# Patient Record
Sex: Male | Born: 1960 | Race: White | Hispanic: No | Marital: Married | Smoking: Never smoker
Health system: Southern US, Community
[De-identification: ages and names within clinical notes are randomized; demographics above are authoritative.]

## PROBLEM LIST (undated history)

## (undated) DIAGNOSIS — E781 Pure hyperglyceridemia: Secondary | ICD-10-CM

## (undated) DIAGNOSIS — H35102 Retinopathy of prematurity, unspecified, left eye: Secondary | ICD-10-CM

## (undated) DIAGNOSIS — K219 Gastro-esophageal reflux disease without esophagitis: Secondary | ICD-10-CM

## (undated) DIAGNOSIS — M797 Fibromyalgia: Secondary | ICD-10-CM

## (undated) DIAGNOSIS — G894 Chronic pain syndrome: Secondary | ICD-10-CM

## (undated) DIAGNOSIS — I10 Essential (primary) hypertension: Secondary | ICD-10-CM

## (undated) DIAGNOSIS — E119 Type 2 diabetes mellitus without complications: Secondary | ICD-10-CM

## (undated) DIAGNOSIS — I8393 Asymptomatic varicose veins of bilateral lower extremities: Secondary | ICD-10-CM

## (undated) DIAGNOSIS — E1140 Type 2 diabetes mellitus with diabetic neuropathy, unspecified: Secondary | ICD-10-CM

## (undated) DIAGNOSIS — G4730 Sleep apnea, unspecified: Secondary | ICD-10-CM

## (undated) HISTORY — PX: OTHER SURGICAL HISTORY: SHX169

## (undated) HISTORY — PX: PROSTATECTOMY: SHX69

---

## 2001-05-31 ENCOUNTER — Encounter: Admission: RE | Admit: 2001-05-31 | Discharge: 2001-08-29 | Payer: Self-pay | Admitting: Family Medicine

## 2001-06-19 ENCOUNTER — Encounter: Payer: Self-pay | Admitting: Family Medicine

## 2001-06-19 ENCOUNTER — Ambulatory Visit (HOSPITAL_COMMUNITY): Admission: RE | Admit: 2001-06-19 | Discharge: 2001-06-19 | Payer: Self-pay | Admitting: Family Medicine

## 2002-04-09 ENCOUNTER — Encounter: Payer: Self-pay | Admitting: Family Medicine

## 2002-04-09 ENCOUNTER — Ambulatory Visit (HOSPITAL_COMMUNITY): Admission: RE | Admit: 2002-04-09 | Discharge: 2002-04-09 | Payer: Self-pay | Admitting: Family Medicine

## 2003-03-16 ENCOUNTER — Emergency Department (HOSPITAL_COMMUNITY): Admission: EM | Admit: 2003-03-16 | Discharge: 2003-03-16 | Payer: Self-pay | Admitting: Internal Medicine

## 2003-10-02 ENCOUNTER — Emergency Department (HOSPITAL_COMMUNITY): Admission: EM | Admit: 2003-10-02 | Discharge: 2003-10-02 | Payer: Self-pay | Admitting: Internal Medicine

## 2004-07-12 ENCOUNTER — Ambulatory Visit (HOSPITAL_COMMUNITY): Admission: RE | Admit: 2004-07-12 | Discharge: 2004-07-12 | Payer: Self-pay | Admitting: Family Medicine

## 2004-08-15 ENCOUNTER — Emergency Department (HOSPITAL_COMMUNITY): Admission: EM | Admit: 2004-08-15 | Discharge: 2004-08-15 | Payer: Self-pay | Admitting: Emergency Medicine

## 2004-08-17 ENCOUNTER — Emergency Department (HOSPITAL_COMMUNITY): Admission: EM | Admit: 2004-08-17 | Discharge: 2004-08-17 | Payer: Self-pay | Admitting: *Deleted

## 2004-09-10 ENCOUNTER — Emergency Department (HOSPITAL_COMMUNITY): Admission: EM | Admit: 2004-09-10 | Discharge: 2004-09-10 | Payer: Self-pay | Admitting: Emergency Medicine

## 2004-11-30 ENCOUNTER — Ambulatory Visit: Payer: Self-pay | Admitting: Family Medicine

## 2005-03-20 ENCOUNTER — Emergency Department (HOSPITAL_COMMUNITY): Admission: EM | Admit: 2005-03-20 | Discharge: 2005-03-20 | Payer: Self-pay | Admitting: Emergency Medicine

## 2005-03-22 ENCOUNTER — Emergency Department (HOSPITAL_COMMUNITY): Admission: EM | Admit: 2005-03-22 | Discharge: 2005-03-22 | Payer: Self-pay | Admitting: Emergency Medicine

## 2005-04-25 ENCOUNTER — Ambulatory Visit: Payer: Self-pay | Admitting: Family Medicine

## 2005-04-26 ENCOUNTER — Ambulatory Visit (HOSPITAL_COMMUNITY): Admission: RE | Admit: 2005-04-26 | Discharge: 2005-04-26 | Payer: Self-pay | Admitting: Family Medicine

## 2005-05-10 ENCOUNTER — Ambulatory Visit: Admission: RE | Admit: 2005-05-10 | Discharge: 2005-05-10 | Payer: Self-pay | Admitting: Family Medicine

## 2005-05-25 ENCOUNTER — Ambulatory Visit: Payer: Self-pay | Admitting: Pulmonary Disease

## 2005-09-15 ENCOUNTER — Ambulatory Visit: Payer: Self-pay | Admitting: Family Medicine

## 2005-10-11 ENCOUNTER — Emergency Department (HOSPITAL_COMMUNITY): Admission: EM | Admit: 2005-10-11 | Discharge: 2005-10-11 | Payer: Self-pay | Admitting: Emergency Medicine

## 2006-04-20 ENCOUNTER — Emergency Department (HOSPITAL_COMMUNITY): Admission: EM | Admit: 2006-04-20 | Discharge: 2006-04-20 | Payer: Self-pay | Admitting: Emergency Medicine

## 2006-04-20 ENCOUNTER — Ambulatory Visit: Payer: Self-pay | Admitting: Family Medicine

## 2006-07-08 ENCOUNTER — Encounter: Payer: Self-pay | Admitting: Family Medicine

## 2006-07-08 LAB — CONVERTED CEMR LAB: PSA: 0.86 ng/mL

## 2007-01-08 ENCOUNTER — Ambulatory Visit: Payer: Self-pay | Admitting: Family Medicine

## 2007-01-09 ENCOUNTER — Encounter: Payer: Self-pay | Admitting: Family Medicine

## 2007-01-09 LAB — CONVERTED CEMR LAB
BUN: 20 mg/dL (ref 6–23)
CO2: 23 meq/L (ref 19–32)
Calcium: 9.5 mg/dL (ref 8.4–10.5)
Chloride: 103 meq/L (ref 96–112)
Cholesterol: 196 mg/dL (ref 0–200)
HDL: 38 mg/dL — ABNORMAL LOW (ref >39)
Hgb A1c MFr Bld: 9.8 % — ABNORMAL HIGH (ref 4.6–6.1)
Lymphocytes Relative: 31 % (ref 12–46)
Lymphs Abs: 2.3 10*3/uL (ref 0.7–3.3)
MCHC: 33.4 g/dL (ref 30.0–36.0)
MCV: 89.7 fL (ref 78.0–100.0)
Monocytes Relative: 9 % (ref 3–11)
Neutro Abs: 4.1 10*3/uL (ref 1.7–7.7)
Neutrophils Relative %: 56 % (ref 43–77)
Platelets: 204 10*3/uL (ref 150–400)
Potassium: 4.3 meq/L (ref 3.5–5.3)
Total CHOL/HDL Ratio: 5.2
Triglycerides: 630 mg/dL — ABNORMAL HIGH (ref <150)

## 2007-01-12 ENCOUNTER — Encounter: Payer: Self-pay | Admitting: Family Medicine

## 2007-01-12 LAB — CONVERTED CEMR LAB
ALT: 35 units/L (ref 0–53)
AST: 19 units/L (ref 0–37)
Alkaline Phosphatase: 70 units/L (ref 39–117)
Bilirubin, Direct: 0.1 mg/dL (ref 0.0–0.3)
Indirect Bilirubin: 0.4 mg/dL (ref 0.0–0.9)
Total Bilirubin: 0.5 mg/dL (ref 0.3–1.2)

## 2007-04-11 ENCOUNTER — Emergency Department (HOSPITAL_COMMUNITY): Admission: EM | Admit: 2007-04-11 | Discharge: 2007-04-11 | Payer: Self-pay | Admitting: Emergency Medicine

## 2007-05-11 ENCOUNTER — Encounter: Payer: Self-pay | Admitting: Family Medicine

## 2007-05-11 LAB — CONVERTED CEMR LAB
ALT: 45 units/L (ref 0–53)
AST: 22 units/L (ref 0–37)
Alkaline Phosphatase: 67 units/L (ref 39–117)
Calcium: 9.7 mg/dL (ref 8.4–10.5)
Cholesterol: 165 mg/dL (ref 0–200)
Creatinine, Ser: 0.97 mg/dL (ref 0.40–1.50)
HDL: 44 mg/dL (ref >39)
Hgb A1c MFr Bld: 8.5 % — ABNORMAL HIGH (ref 4.6–6.1)
Indirect Bilirubin: 0.5 mg/dL (ref 0.0–0.9)
Potassium: 4.1 meq/L (ref 3.5–5.3)
Total Protein: 7.1 g/dL (ref 6.0–8.3)
VLDL: 45 mg/dL — ABNORMAL HIGH (ref 0–40)

## 2007-11-07 ENCOUNTER — Ambulatory Visit (HOSPITAL_COMMUNITY): Admission: RE | Admit: 2007-11-07 | Discharge: 2007-11-07 | Payer: Self-pay | Admitting: Family Medicine

## 2007-11-07 ENCOUNTER — Ambulatory Visit: Payer: Self-pay | Admitting: Family Medicine

## 2007-11-07 LAB — CONVERTED CEMR LAB
ALT: 45 units/L (ref 0–53)
AST: 21 units/L (ref 0–37)
Albumin: 5 g/dL (ref 3.5–5.2)
Alkaline Phosphatase: 81 units/L (ref 39–117)
BUN: 16 mg/dL (ref 6–23)
Basophils Absolute: 0 10*3/uL (ref 0.0–0.1)
Basophils Relative: 0 % (ref 0–1)
Bilirubin, Direct: 0.1 mg/dL (ref 0.0–0.3)
Cholesterol: 173 mg/dL (ref 0–200)
Creatinine, Ser: 0.94 mg/dL (ref 0.40–1.50)
Eosinophils Relative: 3 % (ref 0–5)
HCT: 52.2 % — ABNORMAL HIGH (ref 39.0–52.0)
Hemoglobin: 18.5 g/dL — ABNORMAL HIGH (ref 13.0–17.0)
Indirect Bilirubin: 0.4 mg/dL (ref 0.0–0.9)
Lymphs Abs: 2.8 10*3/uL (ref 0.7–4.0)
MCV: 88 fL (ref 78.0–100.0)
Microalb, Ur: 16.7 mg/dL — ABNORMAL HIGH (ref 0.00–1.89)
Monocytes Absolute: 0.7 10*3/uL (ref 0.1–1.0)
Monocytes Relative: 8 % (ref 3–12)
Neutro Abs: 4.2 10*3/uL (ref 1.7–7.7)
Neutrophils Relative %: 53 % (ref 43–77)
RBC: 5.93 M/uL — ABNORMAL HIGH (ref 4.22–5.81)
RDW: 13.2 % (ref 11.5–15.5)
Sodium: 141 meq/L (ref 135–145)
Total Protein: 7.5 g/dL (ref 6.0–8.3)

## 2007-11-08 ENCOUNTER — Ambulatory Visit (HOSPITAL_COMMUNITY): Admission: RE | Admit: 2007-11-08 | Discharge: 2007-11-08 | Payer: Self-pay | Admitting: Family Medicine

## 2007-12-27 ENCOUNTER — Encounter: Payer: Self-pay | Admitting: Family Medicine

## 2008-03-12 ENCOUNTER — Ambulatory Visit: Payer: Self-pay | Admitting: Family Medicine

## 2008-07-22 ENCOUNTER — Encounter: Payer: Self-pay | Admitting: Family Medicine

## 2008-07-22 DIAGNOSIS — E669 Obesity, unspecified: Secondary | ICD-10-CM | POA: Insufficient documentation

## 2008-07-22 DIAGNOSIS — I10 Essential (primary) hypertension: Secondary | ICD-10-CM | POA: Insufficient documentation

## 2008-07-22 DIAGNOSIS — I868 Varicose veins of other specified sites: Secondary | ICD-10-CM | POA: Insufficient documentation

## 2008-07-22 DIAGNOSIS — E119 Type 2 diabetes mellitus without complications: Secondary | ICD-10-CM | POA: Insufficient documentation

## 2009-01-01 ENCOUNTER — Encounter: Payer: Self-pay | Admitting: Family Medicine

## 2011-01-25 NOTE — Letter (Signed)
Summary: RPC chart  RPC chart   Imported By: Curtis Sites 08/03/2010 10:56:45  _____________________________________________________________________  External Attachment:    Type:   Image     Comment:   External Document

## 2011-05-13 NOTE — Procedures (Signed)
NAME:  Steve Bryan, Steve Bryan NO.:  [id number]   MEDICAL RECORD NO.:  [id number]          PATIENT TYPE:  OUT   LOCATION:  SLEEP LAB                     FACILITY:  [facility]   PHYSICIAN:  Marcelyn Bruins, M.D. [facility] DATE OF BIRTH:  [birth year]   DATE OF STUDY:  05/10/2005                              NOCTURNAL POLYSOMNOGRAM   REFERRING PHYSICIAN:  Dr. Syliva Overman   DATE OF STUDY:  May 10, 2005   INDICATIONS FOR THE STUDY:  Hypersomnia with sleep apnea.   SLEEP ARCHITECTURE:  The patient has total sleep time of 353 minutes with  decreased REM and slow wave sleep. Sleep onset latency was mildly prolonged,  and REM onset was normal.   IMPRESSION:  1. Split night study reveals mild obstructive sleep apnea/hypopnea      syndrome with 38 obstructive events noted in the first 159 minutes of      sleep. This gave the patient a respiratory disturbance index of 14      events per hour extrapolated over the entire study, and there was      desaturation as low as 91%. The obstructive events all occurred in the      supine position and were associated with mild to moderate snoring. By      protocol, the patient was placed on a standard ResMed CPAP mask, and      pressure titration was initiated. A pressure of 6 cm seemed to control      the patient's obstructive events. However, there was breakthrough      snoring during his last REM, and, therefore, I would recommend a      starting pressure of 7 cm per treatment. It should also be noted the      patient's sleep apnea can be treated by weight loss, positional      therapy, or consideration of upper airway surgery/oral appliance.      Clinical correlation is suggested.  2. No cardiac arrhythmia.      KC/MEDQ  D:  05/25/2005 12:10:32  T:  05/25/2005 12:43:19  Job:  [id number]

## 2014-03-21 ENCOUNTER — Encounter (INDEPENDENT_AMBULATORY_CARE_PROVIDER_SITE_OTHER): Payer: Self-pay | Admitting: *Deleted

## 2014-04-01 ENCOUNTER — Telehealth (INDEPENDENT_AMBULATORY_CARE_PROVIDER_SITE_OTHER): Payer: Self-pay | Admitting: *Deleted

## 2014-04-01 ENCOUNTER — Other Ambulatory Visit (INDEPENDENT_AMBULATORY_CARE_PROVIDER_SITE_OTHER): Payer: Self-pay | Admitting: *Deleted

## 2014-04-01 ENCOUNTER — Encounter (INDEPENDENT_AMBULATORY_CARE_PROVIDER_SITE_OTHER): Payer: Self-pay | Admitting: *Deleted

## 2014-04-01 DIAGNOSIS — Z1211 Encounter for screening for malignant neoplasm of colon: Secondary | ICD-10-CM

## 2014-04-01 NOTE — Telephone Encounter (Signed)
[place] auth # [id number] for TCS

## 2014-05-20 ENCOUNTER — Telehealth (INDEPENDENT_AMBULATORY_CARE_PROVIDER_SITE_OTHER): Payer: Self-pay | Admitting: *Deleted

## 2014-05-20 NOTE — Telephone Encounter (Signed)
agree

## 2014-05-20 NOTE — Telephone Encounter (Signed)
  Procedure: tcs  Reason/Indication:  screening  Has patient had this procedure before?  no  If so, when, by whom and where?    Is there a family history of colon cancer?  no  Who?  What age when diagnosed?    Is patient diabetic?   yes      Does patient have prosthetic heart valve?  no  Do you have a pacemaker?  no  Has patient ever had endocarditis? no  Has patient had joint replacement within last 12 months?  no  Does patient tend to be constipated or take laxatives? no  Is patient on Coumadin, Plavix and/or Aspirin? no  Medications: metformin 500 mg 2 tab bid, glipizide 10 mg bid, lantus 10 units nightly, venlafaxine 37.5 mg 4 tabs bid, tramadol 50 mg bid, ergocalciferol 50,000 units 1 tab weekly, losartan 50 mg daily, omeprazole 20 mg daily  Allergies: lisinopril  Medication Adjustment: hold evening dose of metformin & glipizide evening before and morning of, no change to lantus  Procedure date & time: 06/19/14 at 930   tcs

## 2014-06-04 ENCOUNTER — Encounter (HOSPITAL_COMMUNITY): Payer: Self-pay | Admitting: Pharmacy Technician

## 2014-06-19 ENCOUNTER — Encounter (HOSPITAL_COMMUNITY)
Admission: RE | Disposition: A | Discharge status: Home / Self Care | Payer: Self-pay | Source: Ambulatory Visit | Attending: Internal Medicine

## 2014-06-19 ENCOUNTER — Ambulatory Visit (HOSPITAL_COMMUNITY)
Admission: RE | Admit: 2014-06-19 | Discharge: 2014-06-19 | Disposition: A | Discharge status: Home / Self Care | Payer: Non-veteran care | Source: Ambulatory Visit | Attending: Internal Medicine | Admitting: Internal Medicine

## 2014-06-19 ENCOUNTER — Encounter (HOSPITAL_COMMUNITY): Payer: Self-pay | Admitting: *Deleted

## 2014-06-19 DIAGNOSIS — Z1211 Encounter for screening for malignant neoplasm of colon: Secondary | ICD-10-CM

## 2014-06-19 DIAGNOSIS — Z794 Long term (current) use of insulin: Secondary | ICD-10-CM | POA: Insufficient documentation

## 2014-06-19 DIAGNOSIS — E1149 Type 2 diabetes mellitus with other diabetic neurological complication: Secondary | ICD-10-CM | POA: Insufficient documentation

## 2014-06-19 DIAGNOSIS — I10 Essential (primary) hypertension: Secondary | ICD-10-CM | POA: Insufficient documentation

## 2014-06-19 DIAGNOSIS — D126 Benign neoplasm of colon, unspecified: Secondary | ICD-10-CM | POA: Diagnosis not present

## 2014-06-19 DIAGNOSIS — E1142 Type 2 diabetes mellitus with diabetic polyneuropathy: Secondary | ICD-10-CM | POA: Insufficient documentation

## 2014-06-19 DIAGNOSIS — K644 Residual hemorrhoidal skin tags: Secondary | ICD-10-CM

## 2014-06-19 DIAGNOSIS — Z800 Family history of malignant neoplasm of digestive organs: Secondary | ICD-10-CM | POA: Insufficient documentation

## 2014-06-19 DIAGNOSIS — Z79899 Other long term (current) drug therapy: Secondary | ICD-10-CM | POA: Insufficient documentation

## 2014-06-19 DIAGNOSIS — K219 Gastro-esophageal reflux disease without esophagitis: Secondary | ICD-10-CM | POA: Insufficient documentation

## 2014-06-19 HISTORY — DX: Retinopathy of prematurity, unspecified, left eye: H35.102

## 2014-06-19 HISTORY — DX: Gastro-esophageal reflux disease without esophagitis: K21.9

## 2014-06-19 HISTORY — DX: Type 2 diabetes mellitus with diabetic neuropathy, unspecified: E11.40

## 2014-06-19 HISTORY — DX: Essential (primary) hypertension: I10

## 2014-06-19 HISTORY — DX: Sleep apnea, unspecified: G47.30

## 2014-06-19 HISTORY — DX: Type 2 diabetes mellitus without complications: E11.9

## 2014-06-19 HISTORY — PX: COLONOSCOPY: SHX5424

## 2014-06-19 LAB — GLUCOSE, CAPILLARY: Glucose-Capillary: 149 mg/dL — ABNORMAL HIGH (ref 70–99)

## 2014-06-19 SURGERY — COLONOSCOPY
Anesthesia: Moderate Sedation

## 2014-06-19 MED ORDER — MEPERIDINE HCL 50 MG/ML IJ SOLN
INTRAMUSCULAR | Status: AC
  Filled 2014-06-19: qty 1

## 2014-06-19 MED ORDER — MEPERIDINE HCL 50 MG/ML IJ SOLN
INTRAMUSCULAR | Status: DC | PRN
  Administered 2014-06-19 (×2): 25 mg via INTRAVENOUS

## 2014-06-19 MED ORDER — SODIUM CHLORIDE 0.9 % IV SOLN
INTRAVENOUS | Status: DC
  Administered 2014-06-19: 09:00:00 via INTRAVENOUS

## 2014-06-19 MED ORDER — MIDAZOLAM HCL 5 MG/5ML IJ SOLN
INTRAMUSCULAR | Status: DC | PRN
  Administered 2014-06-19: 3 mg via INTRAVENOUS
  Administered 2014-06-19 (×4): 2 mg via INTRAVENOUS

## 2014-06-19 MED ORDER — STERILE WATER FOR IRRIGATION IR SOLN
Status: DC | PRN
  Administered 2014-06-19: 10:00:00

## 2014-06-19 MED ORDER — MIDAZOLAM HCL 5 MG/5ML IJ SOLN
INTRAMUSCULAR | Status: AC
  Filled 2014-06-19: qty 5

## 2014-06-19 MED ORDER — MIDAZOLAM HCL 5 MG/5ML IJ SOLN
INTRAMUSCULAR | Status: AC
  Filled 2014-06-19: qty 10

## 2014-06-19 NOTE — Discharge Instructions (Signed)
Resume usual medications and diet. °No driving for 24 hours. °Physician will call with biopsy results. ° ° °Colonoscopy, Care After °Refer to this sheet in the next few weeks. These instructions provide you with information on caring for yourself after your procedure. Your health care provider may also give you more specific instructions. Your treatment has been planned according to current medical practices, but problems sometimes occur. Call your health care provider if you have any problems or questions after your procedure. °WHAT TO EXPECT AFTER THE PROCEDURE  °After your procedure, it is typical to have the following: °· A small amount of blood in your stool. °· Moderate amounts of gas and mild abdominal cramping or bloating. °HOME CARE INSTRUCTIONS °· Do not drive, operate machinery, or sign important documents for 24 hours. °· You may shower and resume your regular physical activities, but move at a slower pace for the first 24 hours. °· Take frequent rest periods for the first 24 hours. °· Walk around or put a warm pack on your abdomen to help reduce abdominal cramping and bloating. °· Drink enough fluids to keep your urine clear or pale yellow. °· You may resume your normal diet as instructed by your health care provider. Avoid heavy or fried foods that are hard to digest. °· Avoid drinking alcohol for 24 hours or as instructed by your health care provider. °· Only take over-the-counter or prescription medicines as directed by your health care provider. °· If a tissue sample (biopsy) was taken during your procedure: °· Do not take aspirin or blood thinners for 7 days, or as instructed by your health care provider. °· Do not drink alcohol for 7 days, or as instructed by your health care provider. °· Eat soft foods for the first 24 hours. °SEEK MEDICAL CARE IF: °You have persistent spotting of blood in your stool 2-3 days after the procedure. °SEEK IMMEDIATE MEDICAL CARE IF: °· You have more than a small  spotting of blood in your stool. °· You pass large blood clots in your stool. °· Your abdomen is swollen (distended). °· You have nausea or vomiting. °· You have a fever. °· You have increasing abdominal pain that is not relieved with medicine. °Document Released: 07/26/2004 Document Revised: 10/02/2013 Document Reviewed: 08/19/2013 °ExitCare® Patient Information ©2015 [employer]. This information is not intended to replace advice given to you by your health care provider. Make sure you discuss any questions you have with your health care provider. ° ° °Colon Polyps °Polyps are lumps of extra tissue growing inside the body. Polyps can grow in the large intestine (colon). Most colon polyps are noncancerous (benign). However, some colon polyps can become cancerous over time. Polyps that are larger than a pea may be harmful. To be safe, caregivers remove and test all polyps. °CAUSES  °Polyps form when mutations in the genes cause your cells to grow and divide even though no more tissue is needed. °RISK FACTORS °There are a number of risk factors that can increase your chances of getting colon polyps. They include: °· Being older than 50 years. °· Family history of colon polyps or colon cancer. °· Long-term colon diseases, such as colitis or Crohn disease. °· Being overweight. °· Smoking. °· Being inactive. °· Drinking too much alcohol. °SYMPTOMS  °Most small polyps do not cause symptoms. If symptoms are present, they may include: °· Blood in the stool. The stool may look dark red or black. °· Constipation or diarrhea that lasts longer than 1 week. °  DIAGNOSIS °People often do not know they have polyps until their caregiver finds them during a regular checkup. Your caregiver can use 4 tests to check for polyps: °· Digital rectal exam. The caregiver wears gloves and feels inside the rectum. This test would find polyps only in the rectum. °· Barium enema. The caregiver puts a liquid called barium into your rectum before  taking X-rays of your colon. Barium makes your colon look white. Polyps are dark, so they are easy to see in the X-ray pictures. °· Sigmoidoscopy. A thin, flexible tube (sigmoidoscope) is placed into your rectum. The sigmoidoscope has a light and tiny camera in it. The caregiver uses the sigmoidoscope to look at the last third of your colon. °· Colonoscopy. This test is like sigmoidoscopy, but the caregiver looks at the entire colon. This is the most common method for finding and removing polyps. °TREATMENT  °Any polyps will be removed during a sigmoidoscopy or colonoscopy. The polyps are then tested for cancer. °PREVENTION  °To help lower your risk of getting more colon polyps: °· Eat plenty of fruits and vegetables. Avoid eating fatty foods. °· Do not smoke. °· Avoid drinking alcohol. °· Exercise every day. °· Lose weight if recommended by your caregiver. °· Eat plenty of calcium and folate. Foods that are rich in calcium include milk, cheese, and broccoli. Foods that are rich in folate include chickpeas, kidney beans, and spinach. °HOME CARE INSTRUCTIONS °Keep all follow-up appointments as directed by your caregiver. You may need periodic exams to check for polyps. °SEEK MEDICAL CARE IF: °You notice bleeding during a bowel movement. °Document Released: 09/07/2004 Document Revised: 03/05/2012 Document Reviewed: 02/21/2012 °ExitCare® Patient Information ©2015 [employer]. This information is not intended to replace advice given to you by your health care provider. Make sure you discuss any questions you have with your health care provider. ° °

## 2014-06-19 NOTE — H&P (Signed)
Steve Bryan is an 53 y.o. male.   Chief Complaint: Patient is here for colonoscopy. HPI: Patient is 53 year old Caucasian male who is in for screening colonoscopy. He denies abdominal pain change in his bowel habits or rectal bleeding. Family history is negative for CRC.  Past Medical History  Diagnosis Date  . Hypertension   . Diabetes mellitus without complication   . Sleep apnea   . GERD (gastroesophageal reflux disease)   . Diabetic neuropathy   . Left retinopathy of prematurity     Past Surgical History  Procedure Laterality Date  . Right inguinal hernia repair      Family History  Problem Relation Age of Onset  . Colon cancer Neg Hx    Social History:  reports that he has never smoked. He does not have any smokeless tobacco history on file. He reports that he does not drink alcohol or use illicit drugs.  Allergies: No Known Allergies  Medications Prior to Admission  Medication Sig Dispense Refill  . ergocalciferol (VITAMIN D2) 50000 UNITS capsule Take 50,000 Units by mouth once a week.      Marland Kitchen glipiZIDE (GLUCOTROL) 10 MG tablet Take 10 mg by mouth 2 (two) times daily before a meal.      . insulin glargine (LANTUS) 100 UNIT/ML injection Inject 10 Units into the skin at bedtime.      Marland Kitchen losartan (COZAAR) 50 MG tablet Take 50 mg by mouth daily.      . metFORMIN (GLUCOPHAGE) 500 MG tablet Take 1,000 mg by mouth 2 (two) times daily with a meal.      . omeprazole (PRILOSEC) 20 MG capsule Take 20 mg by mouth daily.      . tamsulosin (FLOMAX) 0.4 MG CAPS capsule Take 0.4 mg by mouth.        Results for orders placed during the hospital encounter of 06/19/14 (from the past 48 hour(s))  GLUCOSE, CAPILLARY     Status: Abnormal   Collection Time    06/19/14  9:04 AM      Result Value Ref Range   Glucose-Capillary 149 (*) 70 - 99 mg/dL   Comment 1 Documented in Chart     No results found.  ROS  Blood pressure 153/89, pulse 66, temperature 98.3 F (36.8 C), temperature  source Oral, resp. rate 16, height 6\' 1"  (1.854 m), weight 240 lb (108.863 kg), SpO2 98.00%. Physical Exam  Constitutional: He appears well-developed and well-nourished.  HENT:  Mouth/Throat: Oropharynx is clear and moist.  Eyes: Conjunctivae are normal. No scleral icterus.  Neck: No thyromegaly present.  Cardiovascular: Normal rate, regular rhythm and normal heart sounds.   No murmur heard. Respiratory: Effort normal and breath sounds normal.  GI: Soft. He exhibits no distension and no mass. There is no tenderness.  Musculoskeletal: He exhibits no edema.  Lymphadenopathy:    He has no cervical adenopathy.  Neurological: He is alert.  Skin: Skin is warm and dry.     Assessment/Plan Average risk screening colonoscopy.  SteveNAJEEB Bryan 06/19/2014, 9:37 AM

## 2014-06-19 NOTE — Op Note (Signed)
COLONOSCOPY PROCEDURE REPORT  PATIENT:  Steve Bryan  MR#:  [id number] Birthdate:  [birth year], 53 y.o., male Endoscopist:  Dr. Rogene Houston, MD Referred By:  Dr. Cleophas Dunker, MD Procedure Date: 06/19/2014  Procedure:   Colonoscopy  Indications:  Patient is 54 year old Caucasian male who is undergoing average risk screening colonoscopy.  Informed Consent:  The procedure and risks were reviewed with the patient and informed consent was obtained.  Medications:  Demerol 50 mg IV Versed 11 mg IV  Description of procedure:  After a digital rectal exam was performed, that colonoscope was advanced from the anus through the rectum and colon to the area of the cecum, ileocecal valve and appendiceal orifice. The cecum was deeply intubated. These structures were well-seen and photographed for the record. From the level of the cecum and ileocecal valve, the scope was slowly and cautiously withdrawn. The mucosal surfaces were carefully surveyed utilizing scope tip to flexion to facilitate fold flattening as needed. The scope was pulled down into the rectum where a thorough exam including retroflexion was performed.  Findings:   Prep satisfactory. 4 mm polyp ablated via cold biopsy from proximal transverse colon. Mucosa of the rest of the colon was normal. Normal rectal mucosa.  small hemorrhoids below the dentate line.   Therapeutic/Diagnostic Maneuvers Performed:  See above  Complications:  None  Cecal Withdrawal Time:  17 minutes  Impression:  Examination performed to cecum. 4 mm polyp ablated via cold biopsy from proximal transverse colon. Small external hemorrhoids.  Recommendations:  Standard instructions given. I will contact patient with biopsy results and further recommendations.  [name],[name]  06/19/2014 10:16 AM  CC: Dr. Cleophas Dunker, MD & Dr. Rayne Du ref. provider found

## 2014-06-20 ENCOUNTER — Encounter (HOSPITAL_COMMUNITY): Payer: Self-pay | Admitting: Internal Medicine

## 2015-10-29 ENCOUNTER — Ambulatory Visit (HOSPITAL_BASED_OUTPATIENT_CLINIC_OR_DEPARTMENT_OTHER): Payer: Non-veteran care | Attending: Internal Medicine

## 2015-10-29 VITALS — Ht 73.0 in | Wt 250.0 lb

## 2015-10-29 DIAGNOSIS — R0683 Snoring: Secondary | ICD-10-CM | POA: Diagnosis not present

## 2015-10-29 DIAGNOSIS — G4733 Obstructive sleep apnea (adult) (pediatric): Secondary | ICD-10-CM | POA: Insufficient documentation

## 2015-10-29 DIAGNOSIS — G4700 Insomnia, unspecified: Secondary | ICD-10-CM | POA: Insufficient documentation

## 2015-10-31 DIAGNOSIS — G4733 Obstructive sleep apnea (adult) (pediatric): Secondary | ICD-10-CM | POA: Diagnosis not present

## 2015-10-31 NOTE — Progress Notes (Signed)
   Patient Name: Amol, Domanski Date: 10/29/2015 Gender: Male D.O.B: [birth year] Age (years): 75 Referring Provider: Baird Lyons MD, ABSM Height (inches): 73 Interpreting Physician: Baird Lyons MD, ABSM Weight (lbs): 250 RPSGT: Joni Reining BMI: 33 MRN: [id number] Neck Size: 18.00 CLINICAL INFORMATION Sleep Study Type: NPSG Indication for sleep study: OSA Epworth Sleepiness Score: 2  SLEEP STUDY TECHNIQUE As per the AASM Manual for the Scoring of Sleep and Associated Events v2.3 (April 2016) with a hypopnea requiring 4% desaturations. The channels recorded and monitored were frontal, central and occipital EEG, electrooculogram (EOG), submentalis EMG (chin), nasal and oral airflow, thoracic and abdominal wall motion, anterior tibialis EMG, snore microphone, electrocardiogram, and pulse oximetry.  MEDICATIONS Patient's medications include: charted for review Medications self-administered by patient during sleep study : No sleep medicine administered . SLEEP ARCHITECTURE The study was initiated at 9:52:30 PM and ended at 4:34:37 AM. Sleep onset time was 53.2 minutes and the sleep efficiency was 54.0%. The total sleep time was 217.0 minutes. Stage REM latency was 206.0 minutes. The patient spent 20.49% of the night in stage N1 sleep, 67.99% in stage N2 sleep, 0.00% in stage N3 and 11.52% in REM. Alpha intrusion was absent. Supine sleep was 17.72%. Wake after sleep onset 132 minutes  RESPIRATORY PARAMETERS The overall apnea/hypopnea index (AHI) was 6.1 per hour. There were 3 total apneas, including 2 obstructive, 1 central and 0 mixed apneas. There were 19 hypopneas and 5 RERAs. The AHI during Stage REM sleep was 0.0 per hour. AHI while supine was 34.3 per hour. The mean oxygen saturation was 91.76%. The minimum SpO2 during sleep was 86.00%. Moderate snoring was noted during this study.  CARDIAC DATA The 2 lead EKG demonstrated sinus rhythm. The mean heart rate was  78.91 beats per minute. Other EKG findings include: None.  LEG MOVEMENT DATA The total PLMS were 13 with a resulting PLMS index of 3.60. Associated arousal with leg movement index was 0.3 .  IMPRESSIONS - Mild obstructive sleep apnea occurred during this study (AHI = 6.1/h). - No significant central sleep apnea occurred during this study (CAI = 0.3/h). - Mild oxygen desaturation was noted during this study (Min O2 = 86.00%). - The patient snored with Moderate snoring volume. - No cardiac abnormalities were noted during this study. - Clinically significant periodic limb movements did not occur during sleep. No significant associated arousals. - Difficulty initiating and maintaining sleep-Insomnia. Described by technician as initially anxious about the study.  DIAGNOSIS - Obstructive Sleep Apnea (327.23 [G47.33 ICD-10]) - Insomnia  RECOMMENDATIONS - Positional therapy avoiding supine position during sleep. - Very mild obstructive sleep apnea. Return to discuss treatment options. - Avoid alcohol, sedatives and other CNS depressants that may worsen sleep apnea and disrupt normal sleep architecture. - Sleep hygiene should be reviewed to assess factors that may improve sleep quality. - Weight management and regular exercise should be initiated or continued if appropriate.  Deneise Lever Diplomate, American Board of Sleep Medicine  ELECTRONICALLY SIGNED ON:  10/31/2015, 12:57 PM [place] PH: [phone]   FX: [phone] [place]

## 2016-11-25 ENCOUNTER — Other Ambulatory Visit (HOSPITAL_BASED_OUTPATIENT_CLINIC_OR_DEPARTMENT_OTHER): Payer: Self-pay

## 2016-11-25 ENCOUNTER — Ambulatory Visit (HOSPITAL_BASED_OUTPATIENT_CLINIC_OR_DEPARTMENT_OTHER): Payer: Non-veteran care | Attending: Internal Medicine | Admitting: Internal Medicine

## 2016-11-25 VITALS — Ht 73.0 in | Wt 245.0 lb

## 2016-11-25 DIAGNOSIS — R5383 Other fatigue: Secondary | ICD-10-CM

## 2016-11-25 DIAGNOSIS — I10 Essential (primary) hypertension: Secondary | ICD-10-CM | POA: Diagnosis not present

## 2016-11-25 DIAGNOSIS — E119 Type 2 diabetes mellitus without complications: Secondary | ICD-10-CM | POA: Insufficient documentation

## 2016-11-25 DIAGNOSIS — R0683 Snoring: Secondary | ICD-10-CM

## 2016-11-25 DIAGNOSIS — Z6832 Body mass index (BMI) 32.0-32.9, adult: Secondary | ICD-10-CM | POA: Insufficient documentation

## 2016-11-25 DIAGNOSIS — G4710 Hypersomnia, unspecified: Secondary | ICD-10-CM

## 2016-11-25 DIAGNOSIS — R0681 Apnea, not elsewhere classified: Secondary | ICD-10-CM | POA: Insufficient documentation

## 2016-11-25 DIAGNOSIS — G4730 Sleep apnea, unspecified: Secondary | ICD-10-CM

## 2016-11-25 DIAGNOSIS — E669 Obesity, unspecified: Secondary | ICD-10-CM | POA: Insufficient documentation

## 2016-11-25 DIAGNOSIS — G4761 Periodic limb movement disorder: Secondary | ICD-10-CM | POA: Insufficient documentation

## 2016-11-25 DIAGNOSIS — G4733 Obstructive sleep apnea (adult) (pediatric): Secondary | ICD-10-CM

## 2016-12-03 DIAGNOSIS — G4733 Obstructive sleep apnea (adult) (pediatric): Secondary | ICD-10-CM | POA: Diagnosis not present

## 2016-12-03 NOTE — Procedures (Signed)
  Patient Name: Steve Bryan, Steve Bryan Date: 11/25/2016 Gender: Male D.O.B: 31-Jul-1961 Age (years): 35 Referring Provider: NATHALIE ABI HATEM MD Height (inches): 73 Interpreting Physician: Baird Lyons MD, ABSM Weight (lbs): 245 RPSGT: Jonna Coup BMI: 32 MRN: [id number] Neck Size: 17.50  CLINICAL INFORMATION Sleep Study Type: NPSG Indication for sleep study: Daytime Fatigue, Diabetes, Fatigue, Hypertension, Nocturnal Gasping, Non-refreshing Sleep, Obesity, Snoring Epworth Sleepiness Score: 9  Most recent polysomnogram dated 10/29/2015 revealed an AHI of 6.1/h and RDI of 7.5/h.  SLEEP STUDY TECHNIQUE As per the AASM Manual for the Scoring of Sleep and Associated Events v2.3 (April 2016) with a hypopnea requiring 4% desaturations. The channels recorded and monitored were frontal, central and occipital EEG, electrooculogram (EOG), submentalis EMG (chin), nasal and oral airflow, thoracic and abdominal wall motion, anterior tibialis EMG, snore microphone, electrocardiogram, and pulse oximetry.  MEDICATIONS Medications self-administered by patient taken the night of the study : none reported  SLEEP ARCHITECTURE The study was initiated at 9:34:04 PM and ended at 4:36:02 AM. Sleep onset time was 5.2 minutes and the sleep efficiency was 71.3%. The total sleep time was 300.8 minutes. Stage REM latency was 141.5 minutes. The patient spent 6.65% of the night in stage N1 sleep, 80.55% in stage N2 sleep, 0.00% in stage N3 and 12.80% in REM. Alpha intrusion was absent. Supine sleep was 29.25%.  RESPIRATORY PARAMETERS The overall apnea/hypopnea index (AHI) was 3.0 per hour. There were 1 total apneas, including 1 obstructive, 0 central and 0 mixed apneas. There were 14 hypopneas and 6 RERAs. The AHI during Stage REM sleep was 17.1 per hour. AHI while supine was 10.2 per hour. The mean oxygen saturation was 92.75%. The minimum SpO2 during sleep was 89.00%. Soft snoring was noted during  this study.  CARDIAC DATA The 2 lead EKG demonstrated sinus rhythm. The mean heart rate was 79.15 beats per minute. Other EKG findings include: PVCs.  LEG MOVEMENT DATA The total PLMS were 37 with a resulting PLMS index of 7.38. Associated arousal with leg movement index was 2.0 .  IMPRESSIONS - No significant obstructive sleep apnea occurred during this study (AHI = 3.0/h). - No significant central sleep apnea occurred during this study (CAI = 0.0/h). - The patient had minimal or no oxygen desaturation during the study (Min O2 = 89.00%) - The patient snored with Soft snoring volume. - EKG findings include PVCs. - Mild periodic limb movements of sleep occurred during the study. No significant associated arousals.  DIAGNOSIS - Normal study  RECOMMENDATIONS - Positional therapy avoiding supine position during sleep. - Avoid alcohol, sedatives and other CNS depressants that may worsen sleep apnea and disrupt normal sleep architecture. - Sleep hygiene should be reviewed to assess factors that may improve sleep quality. - Weight management and regular exercise should be initiated or continued if appropriate.  [Electronically signed] 12/03/2016 10:00 AM  Baird Lyons MD, ABSM Diplomate, American Board of Sleep Medicine   NPI: [id number]  Playa Fortuna, American Board of Sleep Medicine  ELECTRONICALLY SIGNED ON:  12/03/2016, 10:02 AM [place] PH: [phone]   FX: [phone] [place]

## 2017-02-27 ENCOUNTER — Emergency Department (HOSPITAL_COMMUNITY): Payer: Non-veteran care

## 2017-02-27 ENCOUNTER — Encounter (HOSPITAL_COMMUNITY): Payer: Self-pay | Admitting: Emergency Medicine

## 2017-02-27 ENCOUNTER — Inpatient Hospital Stay (HOSPITAL_COMMUNITY)
Admission: EM | Admit: 2017-02-27 | Discharge: 2017-03-01 | DRG: 282 | Disposition: A | Discharge status: Home / Self Care | Payer: Non-veteran care | Attending: Cardiovascular Disease | Admitting: Cardiovascular Disease

## 2017-02-27 DIAGNOSIS — R079 Chest pain, unspecified: Secondary | ICD-10-CM | POA: Diagnosis present

## 2017-02-27 DIAGNOSIS — Z888 Allergy status to other drugs, medicaments and biological substances status: Secondary | ICD-10-CM

## 2017-02-27 DIAGNOSIS — M797 Fibromyalgia: Secondary | ICD-10-CM | POA: Diagnosis present

## 2017-02-27 DIAGNOSIS — I361 Nonrheumatic tricuspid (valve) insufficiency: Secondary | ICD-10-CM | POA: Diagnosis not present

## 2017-02-27 DIAGNOSIS — G4730 Sleep apnea, unspecified: Secondary | ICD-10-CM | POA: Diagnosis present

## 2017-02-27 DIAGNOSIS — E669 Obesity, unspecified: Secondary | ICD-10-CM | POA: Diagnosis present

## 2017-02-27 DIAGNOSIS — Z6833 Body mass index (BMI) 33.0-33.9, adult: Secondary | ICD-10-CM

## 2017-02-27 DIAGNOSIS — G894 Chronic pain syndrome: Secondary | ICD-10-CM | POA: Diagnosis present

## 2017-02-27 DIAGNOSIS — R748 Abnormal levels of other serum enzymes: Secondary | ICD-10-CM | POA: Diagnosis not present

## 2017-02-27 DIAGNOSIS — I214 Non-ST elevation (NSTEMI) myocardial infarction: Principal | ICD-10-CM | POA: Diagnosis present

## 2017-02-27 DIAGNOSIS — R7989 Other specified abnormal findings of blood chemistry: Secondary | ICD-10-CM

## 2017-02-27 DIAGNOSIS — E781 Pure hyperglyceridemia: Secondary | ICD-10-CM | POA: Diagnosis present

## 2017-02-27 DIAGNOSIS — E1140 Type 2 diabetes mellitus with diabetic neuropathy, unspecified: Secondary | ICD-10-CM | POA: Diagnosis present

## 2017-02-27 DIAGNOSIS — Z79899 Other long term (current) drug therapy: Secondary | ICD-10-CM

## 2017-02-27 DIAGNOSIS — R0789 Other chest pain: Secondary | ICD-10-CM | POA: Diagnosis present

## 2017-02-27 DIAGNOSIS — R778 Other specified abnormalities of plasma proteins: Secondary | ICD-10-CM

## 2017-02-27 DIAGNOSIS — K219 Gastro-esophageal reflux disease without esophagitis: Secondary | ICD-10-CM | POA: Diagnosis present

## 2017-02-27 DIAGNOSIS — I10 Essential (primary) hypertension: Secondary | ICD-10-CM | POA: Diagnosis present

## 2017-02-27 DIAGNOSIS — I480 Paroxysmal atrial fibrillation: Secondary | ICD-10-CM | POA: Diagnosis present

## 2017-02-27 DIAGNOSIS — I272 Pulmonary hypertension, unspecified: Secondary | ICD-10-CM | POA: Diagnosis present

## 2017-02-27 DIAGNOSIS — M7989 Other specified soft tissue disorders: Secondary | ICD-10-CM | POA: Diagnosis present

## 2017-02-27 DIAGNOSIS — R9439 Abnormal result of other cardiovascular function study: Secondary | ICD-10-CM | POA: Diagnosis present

## 2017-02-27 DIAGNOSIS — Z794 Long term (current) use of insulin: Secondary | ICD-10-CM | POA: Diagnosis not present

## 2017-02-27 HISTORY — DX: Fibromyalgia: M79.7

## 2017-02-27 HISTORY — DX: Chronic pain syndrome: G89.4

## 2017-02-27 HISTORY — DX: Asymptomatic varicose veins of bilateral lower extremities: I83.93

## 2017-02-27 HISTORY — DX: Pure hyperglyceridemia: E78.1

## 2017-02-27 LAB — COMPREHENSIVE METABOLIC PANEL
ALT: 50 U/L (ref 17–63)
AST: 31 U/L (ref 15–41)
Albumin: 4.2 g/dL (ref 3.5–5.0)
Alkaline Phosphatase: 67 U/L (ref 38–126)
Anion gap: 9 (ref 5–15)
BUN: 17 mg/dL (ref 6–20)
CO2: 21 mmol/L — ABNORMAL LOW (ref 22–32)
Calcium: 9.4 mg/dL (ref 8.9–10.3)
Chloride: 107 mmol/L (ref 101–111)
Creatinine, Ser: 0.99 mg/dL (ref 0.61–1.24)
Glucose, Bld: 226 mg/dL — ABNORMAL HIGH (ref 65–99)
POTASSIUM: 4.2 mmol/L (ref 3.5–5.1)
Sodium: 137 mmol/L (ref 135–145)
TOTAL PROTEIN: 6.9 g/dL (ref 6.5–8.1)
Total Bilirubin: 0.4 mg/dL (ref 0.3–1.2)

## 2017-02-27 LAB — CBC
HEMATOCRIT: 44.2 % (ref 39.0–52.0)
HEMOGLOBIN: 15.5 g/dL (ref 13.0–17.0)
MCH: 30.6 pg (ref 26.0–34.0)
MCHC: 35.1 g/dL (ref 30.0–36.0)
MCV: 87.4 fL (ref 78.0–100.0)
Platelets: 193 10*3/uL (ref 150–400)
RBC: 5.06 MIL/uL (ref 4.22–5.81)
RDW: 13.1 % (ref 11.5–15.5)
WBC: 8.2 10*3/uL (ref 4.0–10.5)

## 2017-02-27 LAB — HEPARIN LEVEL (UNFRACTIONATED): Heparin Unfractionated: <0.1 [IU]/mL — ABNORMAL LOW (ref 0.30–0.70)

## 2017-02-27 LAB — TROPONIN I
TROPONIN I: 0.07 ng/mL — AB (ref <0.03)
TROPONIN I: 0.08 ng/mL — AB (ref <0.03)
Troponin I: 0.04 ng/mL (ref <0.03)

## 2017-02-27 LAB — PROTIME-INR
INR: 0.95
PROTHROMBIN TIME: 12.7 s (ref 11.4–15.2)

## 2017-02-27 LAB — GLUCOSE, CAPILLARY: GLUCOSE-CAPILLARY: 238 mg/dL — AB (ref 65–99)

## 2017-02-27 MED ORDER — HEPARIN (PORCINE) IN NACL 100-0.45 UNIT/ML-% IJ SOLN
1900.0000 [IU]/h | INTRAMUSCULAR | Status: DC
  Administered 2017-02-27: 1300 [IU]/h via INTRAVENOUS
  Filled 2017-02-27 (×2): qty 250

## 2017-02-27 MED ORDER — ASPIRIN 81 MG PO CHEW
81.0000 mg | CHEWABLE_TABLET | Freq: Once | ORAL | Status: AC
  Administered 2017-02-28: 81 mg via ORAL
  Filled 2017-02-27: qty 1

## 2017-02-27 MED ORDER — ASPIRIN 81 MG PO CHEW
81.0000 mg | CHEWABLE_TABLET | ORAL | Status: AC
  Administered 2017-02-28: 81 mg via ORAL
  Filled 2017-02-27: qty 1

## 2017-02-27 MED ORDER — ONDANSETRON HCL 4 MG/2ML IJ SOLN
4.0000 mg | Freq: Once | INTRAMUSCULAR | Status: AC
  Administered 2017-02-27: 4 mg via INTRAVENOUS
  Filled 2017-02-27: qty 2

## 2017-02-27 MED ORDER — HEPARIN SODIUM (PORCINE) 5000 UNIT/ML IJ SOLN
INTRAMUSCULAR | Status: AC
  Administered 2017-02-27: 4000 [IU]
  Filled 2017-02-27: qty 1

## 2017-02-27 MED ORDER — METHOCARBAMOL 500 MG PO TABS: 500.0000 mg | ORAL_TABLET | Freq: Three times a day (TID) | ORAL | Status: DC | PRN

## 2017-02-27 MED ORDER — DULOXETINE HCL 60 MG PO CPEP
60.0000 mg | ORAL_CAPSULE | Freq: Every day | ORAL | Status: DC
  Administered 2017-02-28 – 2017-03-01 (×2): 60 mg via ORAL
  Filled 2017-02-27 (×2): qty 1

## 2017-02-27 MED ORDER — ACETAMINOPHEN 325 MG PO TABS
650.0000 mg | ORAL_TABLET | ORAL | Status: DC | PRN
  Administered 2017-02-28 (×2): 650 mg via ORAL
  Filled 2017-02-27 (×2): qty 2

## 2017-02-27 MED ORDER — MORPHINE SULFATE (PF) 2 MG/ML IV SOLN
2.0000 mg | Freq: Once | INTRAVENOUS | Status: AC
  Administered 2017-02-27: 2 mg via INTRAVENOUS
  Filled 2017-02-27: qty 1

## 2017-02-27 MED ORDER — HEPARIN BOLUS VIA INFUSION: 4000.0000 [IU] | Freq: Once | INTRAVENOUS | Status: DC

## 2017-02-27 MED ORDER — SODIUM CHLORIDE 0.9% FLUSH: 3.0000 mL | INTRAVENOUS | Status: DC | PRN

## 2017-02-27 MED ORDER — INSULIN ASPART 100 UNIT/ML ~~LOC~~ SOLN
0.0000 [IU] | Freq: Every day | SUBCUTANEOUS | Status: DC
  Administered 2017-02-27: 2 [IU] via SUBCUTANEOUS

## 2017-02-27 MED ORDER — SODIUM CHLORIDE 0.9 % WEIGHT BASED INFUSION
3.0000 mL/kg/h | INTRAVENOUS | Status: DC
  Administered 2017-02-28: 3 mL/kg/h via INTRAVENOUS

## 2017-02-27 MED ORDER — SODIUM CHLORIDE 0.9 % IV SOLN: 250.0000 mL | INTRAVENOUS | Status: DC | PRN

## 2017-02-27 MED ORDER — NITROGLYCERIN 0.4 MG SL SUBL
0.4000 mg | SUBLINGUAL_TABLET | SUBLINGUAL | Status: DC | PRN
  Administered 2017-02-27 (×2): 0.4 mg via SUBLINGUAL
  Filled 2017-02-27: qty 1

## 2017-02-27 MED ORDER — NITROGLYCERIN 0.4 MG SL SUBL: 0.4000 mg | SUBLINGUAL_TABLET | SUBLINGUAL | Status: DC | PRN

## 2017-02-27 MED ORDER — INSULIN GLARGINE 100 UNIT/ML ~~LOC~~ SOLN
30.0000 [IU] | Freq: Every day | SUBCUTANEOUS | Status: DC
  Administered 2017-02-27 – 2017-02-28 (×2): 30 [IU] via SUBCUTANEOUS
  Filled 2017-02-27 (×2): qty 0.3

## 2017-02-27 MED ORDER — ONDANSETRON HCL 4 MG/2ML IJ SOLN
4.0000 mg | Freq: Four times a day (QID) | INTRAMUSCULAR | Status: DC | PRN
  Administered 2017-02-28: 4 mg via INTRAVENOUS
  Filled 2017-02-27: qty 2

## 2017-02-27 MED ORDER — TRAMADOL HCL 50 MG PO TABS
50.0000 mg | ORAL_TABLET | Freq: Four times a day (QID) | ORAL | Status: DC | PRN
  Administered 2017-02-28 – 2017-03-01 (×2): 50 mg via ORAL
  Filled 2017-02-27 (×2): qty 1

## 2017-02-27 MED ORDER — PANTOPRAZOLE SODIUM 40 MG PO TBEC
40.0000 mg | DELAYED_RELEASE_TABLET | Freq: Every day | ORAL | Status: DC
  Administered 2017-02-27 – 2017-03-01 (×3): 40 mg via ORAL
  Filled 2017-02-27 (×3): qty 1

## 2017-02-27 MED ORDER — ASPIRIN EC 81 MG PO TBEC
81.0000 mg | DELAYED_RELEASE_TABLET | Freq: Every day | ORAL | Status: DC
  Administered 2017-03-01: 81 mg via ORAL
  Filled 2017-02-27 (×2): qty 1

## 2017-02-27 MED ORDER — ASPIRIN 81 MG PO CHEW
324.0000 mg | CHEWABLE_TABLET | Freq: Once | ORAL | Status: AC
  Administered 2017-02-27: 324 mg via ORAL
  Filled 2017-02-27: qty 4

## 2017-02-27 MED ORDER — SODIUM CHLORIDE 0.9 % WEIGHT BASED INFUSION
1.0000 mL/kg/h | INTRAVENOUS | Status: DC
  Administered 2017-02-28: 1 mL/kg/h via INTRAVENOUS

## 2017-02-27 MED ORDER — INSULIN ASPART 100 UNIT/ML ~~LOC~~ SOLN
0.0000 [IU] | Freq: Three times a day (TID) | SUBCUTANEOUS | Status: DC
  Administered 2017-02-28: 2 [IU] via SUBCUTANEOUS
  Administered 2017-03-01: 1 [IU] via SUBCUTANEOUS
  Administered 2017-03-01: 3 [IU] via SUBCUTANEOUS

## 2017-02-27 MED ORDER — ALLOPURINOL 300 MG PO TABS
300.0000 mg | ORAL_TABLET | Freq: Every day | ORAL | Status: DC
  Administered 2017-02-27 – 2017-03-01 (×3): 300 mg via ORAL
  Filled 2017-02-27 (×3): qty 1

## 2017-02-27 MED ORDER — ADULT MULTIVITAMIN W/MINERALS CH
1.0000 | ORAL_TABLET | Freq: Every day | ORAL | Status: DC
Start: 2017-02-28 — End: 2017-03-01
  Administered 2017-02-28 – 2017-03-01 (×2): 1 via ORAL
  Filled 2017-02-27 (×2): qty 1

## 2017-02-27 MED ORDER — TAMSULOSIN HCL 0.4 MG PO CAPS
0.4000 mg | ORAL_CAPSULE | Freq: Every day | ORAL | Status: DC
  Administered 2017-02-28 – 2017-03-01 (×2): 0.4 mg via ORAL
  Filled 2017-02-27 (×2): qty 1

## 2017-02-27 MED ORDER — AMLODIPINE BESYLATE 10 MG PO TABS
10.0000 mg | ORAL_TABLET | Freq: Every day | ORAL | Status: DC
  Administered 2017-02-27 – 2017-03-01 (×3): 10 mg via ORAL
  Filled 2017-02-27 (×3): qty 1

## 2017-02-27 MED ORDER — SODIUM CHLORIDE 0.9% FLUSH
3.0000 mL | Freq: Two times a day (BID) | INTRAVENOUS | Status: DC
  Administered 2017-02-27: 3 mL via INTRAVENOUS

## 2017-02-27 NOTE — Consult Note (Signed)
Primary cardiologist: [place] cardiologist: Dr Carlyle Dolly MD Requesting: Dr Lita Mains Indication: chest pain  Clinical Summary Steve Bryan is a 56 y.o.male history of HTN, DM2 presents with chest pain. He is regularly followed at the [facility] in [place], recently transferred to the [facility]. He reports a recent diagnosis of afib, but has not started anticoag yet. He just recently completed an event monitor and his physician was awaiting the results. He also reports he recent diagnosis of pulmonary HTN diagnosed by echo, he states he was also told that his pulmonary artery is very enlarged. He has not undergone any further workup.   He reports a 6-8 month history of SOB. Can come on at rest or with activity. Often associated with palpitations. As part of his workup for SOB at the [facility] he complete a nuclear stress test today at the [facility]. On his way home he developed 8/10 pressure left chest radiating down into left arm with nausea and SOB. Symptoms improves significantly with sublingual NG. This is his first episode of chest pain.      ER vitals: p 100 bp 142/93 100% RA K 4.2 Cr 0.99  Trop 0.07--> EKG SR no ischemic changes CXR pending Allergies  Allergen Reactions  . Effexor [Venlafaxine] Other (See Comments)    Night sweats    Medications Scheduled Medications: . heparin  4,000 Units Intravenous Once  .  morphine injection  2 mg Intravenous Once     Infusions:   PRN Medications:  nitroGLYCERIN   Past Medical History:  Diagnosis Date  . Chronic pain syndrome   . Diabetes mellitus without complication ([place])   . Diabetic neuropathy ([place])   . Fibromyalgia   . GERD (gastroesophageal reflux disease)   . Hypertension   . Left retinopathy of prematurity   . Sleep apnea   . Varicose veins of both lower extremities     Past Surgical History:  Procedure Laterality Date  . COLONOSCOPY N/A 06/19/2014   Procedure: COLONOSCOPY;  Surgeon: Rogene Houston, MD;   Location: [facility];  Service: Endoscopy;  Laterality: N/A;  930  . Right Inguinal Hernia Repair      Family History  Problem Relation Age of Onset  . Colon cancer Neg Hx     Social History [name] reports that he has never smoked. He does not have any smokeless tobacco history on file. [name] reports that he does not drink alcohol.  Review of Systems CONSTITUTIONAL: No weight loss, fever, chills, weakness or fatigue.  HEENT: Eyes: No visual loss, blurred vision, double vision or yellow sclerae. No hearing loss, sneezing, congestion, runny nose or sore throat.  SKIN: No rash or itching.  CARDIOVASCULAR: per HPI RESPIRATORY: per HPI GASTROINTESTINAL: No anorexia, nausea, vomiting or diarrhea. No abdominal pain or blood.  GENITOURINARY: no polyuria, no dysuria NEUROLOGICAL: No headache, dizziness, syncope, paralysis, ataxia, numbness or tingling in the extremities. No change in bowel or bladder control.  MUSCULOSKELETAL: No muscle, back pain, joint pain or stiffness.  HEMATOLOGIC: No anemia, bleeding or bruising.  LYMPHATICS: No enlarged nodes. No history of splenectomy.  PSYCHIATRIC: No history of depression or anxiety.      Physical Examination Blood pressure 132/81, pulse 98, temperature 98.1 F (36.7 C), temperature source Oral, resp. rate 25, height 6\' 1"  (1.854 m), weight 250 lb (113.4 kg), SpO2 92 %. No intake or output data in the 24 hours ending 02/27/17 1346  HEENT: sclera clear, throat clear  Cardiovascular: RRR,  no m/r/g, no jvd  Respiratory: CTAB  GI: abdomen soft, NT, ND  MSK: nO LE edema  Neuro: no focal deficits  Psych: appropriate affect   Lab Results  Basic Metabolic Panel:  Recent Labs Lab 02/27/17 1243  NA 137  K 4.2  CL 107  CO2 21*  GLUCOSE 226*  BUN 17  CREATININE 0.99  CALCIUM 9.4    Liver Function Tests:  Recent Labs Lab 02/27/17 1243  AST 31  ALT 50  ALKPHOS 67  BILITOT 0.4  PROT 6.9  ALBUMIN 4.2     CBC:  Recent Labs Lab 02/27/17 1243  WBC 8.2  HGB 15.5  HCT 44.2  MCV 87.4  PLT 193    Cardiac Enzymes:  Recent Labs Lab 02/27/17 1243  TROPONINI 0.07*    BNP: Invalid input(s): POCBNP    Impression/Recommendations 1. Chest pain - patient with multiple CAD risk factors. EKG without specific ischemic changes, initial troponin mildly elevated at 0.07. Story very suggestive of cardiac ischemia, symptoms much improved with SL NG - he has received ASA and Ng in ER. Will start heparin gtt in setting of likely NSTEMI - plan for transfer to cardiology service at [facility] for [facility] likely tomorrow. Make NPO overnight.   2. Afib - recent diagnosis per patient report. He has been having palpitations for several months. EKG in ER shows NSR.  He recently completed an event monitor and has outpatient f/u with his primary physician. He has not been started on anticoag.  - start heparin in setting of NSTEMI. If afib during admission or able to obtain records from [facility] and afib is confirmed will need consideration for anticoag, CHADS2Vasc score based on available info is at least 2 (DM2, HTN).  3. Pulmonary HTN - diagnosis per patient reports, he states he had a recent echo that showed elevated PA pressures he believes around 40, told his pulmonary artery is very enlarged. 6 months of intermittent SOB - obtain RHC along with his LHC. Will repeat echo this admission.   Transfer to [facility], plan for [facility] tomorrow. Discussed with [facility] cardiology coordinator to arrange transfer.   Carlyle Dolly, M.D.

## 2017-02-27 NOTE — Progress Notes (Signed)
ANTICOAGULATION CONSULT NOTE - Follow Up Consult  Pharmacy Consult for Heparin Indication: chest pain/ACS  Allergies  Allergen Reactions  . Effexor [Venlafaxine] Other (See Comments)    Night sweats    Patient Measurements: Height: 6\' 1"  (185.4 cm) Weight: 250 lb (113.4 kg) IBW/kg (Calculated) : 79.9 Heparin Dosing Weight: 103 kg  Vital Signs: Temp: 97.9 F (36.6 C) (03/05 1930) Temp Source: Oral (03/05 1930) BP: 131/74 (03/05 1800) Pulse Rate: 80 (03/05 1800)  Labs:  Recent Labs  02/27/17 1243 02/27/17 1346 02/27/17 2102  HGB 15.5  --   --   HCT 44.2  --   --   PLT 193  --   --   LABPROT  --  12.7  --   INR  --  0.95  --   HEPARINUNFRC  --   --  <0.10*  CREATININE 0.99  --   --   TROPONINI 0.07* 0.08* 0.04*    Estimated Creatinine Clearance: 112.6 mL/min (by C-G formula based on SCr of 0.99 mg/dL).   Medications:  Scheduled:  . allopurinol  300 mg Oral Daily  . amLODipine  10 mg Oral Daily  . [START ON 02/28/2017] aspirin  81 mg Oral Pre-Cath  . [START ON 02/28/2017] aspirin  81 mg Oral Once  . [START ON 02/28/2017] aspirin EC  81 mg Oral Daily  . [START ON 02/28/2017] DULoxetine  60 mg Oral Daily  . heparin  4,000 Units Intravenous Once  . insulin aspart  0-5 Units Subcutaneous QHS  . [START ON 02/28/2017] insulin aspart  0-9 Units Subcutaneous TID WC  . insulin glargine  30 Units Subcutaneous QHS  . [START ON 02/28/2017] multivitamin with minerals  1 tablet Oral Daily  . pantoprazole  40 mg Oral Daily  . sodium chloride flush  3 mL Intravenous Q12H  . tamsulosin  0.4 mg Oral Daily   Infusions:  . [START ON 02/28/2017] sodium chloride     Followed by  . [START ON 02/28/2017] sodium chloride    . heparin 1,300 Units/hr (02/27/17 1545)    Assessment: 56 yo M transferred from [facility] to [facility] for evaluation of chest pain.  Pt was started on heparin at 1300 units/hr prior to transfer.  Confirmed with RN that heparin was running upon arrival to [facility] and there  have been no IV issues overnight.  Heparin level is undetectable on 1300 units/hr.  Will increase.   Goal of Therapy:  Heparin level 0.3-0.7 units/ml Monitor platelets by anticoagulation protocol: Yes   Plan:  Increase heparin to 1700 units/hr  Heparin level with AM labs. Heparin level and CBC daily while on heparin.  [employer], Pharm.D., BCPS Clinical Pharmacist Pager [phone] 02/27/2017 10:03 PM

## 2017-02-27 NOTE — ED Notes (Signed)
Dr Branch at bedside. ?

## 2017-02-27 NOTE — ED Provider Notes (Signed)
[facility] DEPT Provider Note   CSN: [id number] Arrival date & time: 02/27/17  1225     History   Chief Complaint Chief Complaint  Patient presents with  . Chest Pain    HPI Steve Bryan is a 56 y.o. male.  HPI Patient presents with left-sided chest pain radiating to his arm starting around 10:30 this morning. States he had a nuclear stress test this morning at the [facility]. Symptoms started while in the car driving back to his home. States the pain is in waves. Associated with some shortness of breath and no coughing but has had associated nausea. Complains of mild bilateral lower extremity swelling. Has had no history of early coronary artery disease. Mother had MI in her 1s. Past Medical History:  Diagnosis Date  . Chronic pain syndrome   . Diabetes mellitus without complication ([place])   . Diabetic neuropathy ([place])   . Fibromyalgia   . GERD (gastroesophageal reflux disease)   . Hypertension   . Left retinopathy of prematurity   . Sleep apnea   . Varicose veins of both lower extremities     Patient Active Problem List   Diagnosis Date Noted  . DIABETES MELLITUS, TYPE II 07/22/2008  . OBESITY 07/22/2008  . HYPERTENSION 07/22/2008  . VARICOSE VEIN 07/22/2008    Past Surgical History:  Procedure Laterality Date  . COLONOSCOPY N/A 06/19/2014   Procedure: COLONOSCOPY;  Surgeon: Rogene Houston, MD;  Location: [facility];  Service: Endoscopy;  Laterality: N/A;  930  . Right Inguinal Hernia Repair         Home Medications    Prior to Admission medications   Medication Sig Start Date End Date Taking? Authorizing Provider  allopurinol (ZYLOPRIM) 300 MG tablet Take 300 mg by mouth daily.   Yes Historical Provider, MD  amLODipine (NORVASC) 10 MG tablet Take 10 mg by mouth daily.   Yes Historical Provider, MD  DULoxetine (CYMBALTA) 60 MG capsule Take 60 mg by mouth daily.   Yes Historical Provider, MD  ergocalciferol (VITAMIN D2) 50000 UNITS capsule Take 50,000 units  by mouth twice weekly on Wednesday and Saturday   Yes Historical Provider, MD  glipiZIDE (GLUCOTROL) 10 MG tablet Take 10 mg by mouth 2 (two) times daily before a meal.   Yes Historical Provider, MD  insulin glargine (LANTUS) 100 UNIT/ML injection Inject 30 Units into the skin at bedtime.    Yes Historical Provider, MD  meloxicam (MOBIC) 15 MG tablet Take 7.5 mg by mouth daily.   Yes Historical Provider, MD  metFORMIN (GLUCOPHAGE) 500 MG tablet Take 1,000 mg by mouth 2 (two) times daily with a meal.   Yes Historical Provider, MD  methocarbamol (ROBAXIN) 500 MG tablet Take 500 mg by mouth every 8 (eight) hours as needed for muscle spasms.   Yes Historical Provider, MD  Multiple Vitamin (MULTIVITAMIN WITH MINERALS) TABS tablet Take 1 tablet by mouth daily.   Yes Historical Provider, MD  omeprazole (PRILOSEC) 20 MG capsule Take 40 mg by mouth daily.    Yes Historical Provider, MD  tamsulosin (FLOMAX) 0.4 MG CAPS capsule Take 0.4 mg by mouth.   Yes Historical Provider, MD  traMADol (ULTRAM) 50 MG tablet Take 50 mg by mouth every 6 (six) hours as needed.   Yes Historical Provider, MD    Family History Family History  Problem Relation Age of Onset  . Colon cancer Neg Hx     Social History Social History  Substance Use Topics  .  Smoking status: Never Smoker  . Smokeless tobacco: Not on file  . Alcohol use No     Allergies   Effexor [venlafaxine]   Review of Systems Review of Systems  Constitutional: Negative for chills and fever.  HENT: Negative for trouble swallowing and voice change.   Respiratory: Positive for shortness of breath. Negative for cough and wheezing.   Cardiovascular: Positive for chest pain and leg swelling. Negative for palpitations.  Gastrointestinal: Positive for nausea. Negative for abdominal pain, constipation, diarrhea and vomiting.  Genitourinary: Negative for flank pain, frequency and hematuria.  Musculoskeletal: Negative for back pain, myalgias, neck pain and  neck stiffness.  Skin: Negative for rash and wound.  Neurological: Negative for dizziness, weakness, light-headedness, numbness and headaches.  All other systems reviewed and are negative.    Physical Exam Updated Vital Signs BP 132/81   Pulse 98   Temp 98.1 F (36.7 C) (Oral)   Resp 25   Ht 6\' 1"  (1.854 m)   Wt 250 lb (113.4 kg)   SpO2 92%   BMI 32.98 kg/m   Physical Exam  Constitutional: He is oriented to person, place, and time. He appears well-developed and well-nourished. He appears distressed.  HENT:  Head: Normocephalic and atraumatic.  Mouth/Throat: Oropharynx is clear and moist.  No intraoral swelling.  Eyes: EOM are normal. Pupils are equal, round, and reactive to light.  Neck: Normal range of motion. Neck supple. No JVD present.  Cardiovascular: Normal rate and regular rhythm.  Exam reveals no gallop and no friction rub.   No murmur heard. Pulmonary/Chest: Effort normal and breath sounds normal. No respiratory distress. He has no wheezes. He has no rales.  Speaks in a clear voice. No respiratory distress.  Abdominal: Soft. Bowel sounds are normal. There is no tenderness. There is no rebound and no guarding.  Musculoskeletal: Normal range of motion. He exhibits no edema or tenderness.  No lower extremity swelling or asymmetry. Distal pulses intact.  Neurological: He is alert and oriented to person, place, and time.  Moving all extremities without deficit. Sensation fully intact.  Skin: Skin is warm and dry. Capillary refill takes less than 2 seconds. No rash noted. No erythema.  Psychiatric: He has a normal mood and affect. His behavior is normal.  Nursing note and vitals reviewed.    ED Treatments / Results  Labs (all labs ordered are listed, but only abnormal results are displayed) Labs Reviewed  COMPREHENSIVE METABOLIC PANEL - Abnormal; Notable for the following:       Result Value   CO2 21 (*)    Glucose, Bld 226 (*)    All other components within  normal limits  TROPONIN I - Abnormal; Notable for the following:    Troponin I 0.07 (*)    All other components within normal limits  CBC  TROPONIN I  PROTIME-INR    EKG  EKG Interpretation  Date/Time:  Monday February 27 2017 13:00:59 EST Ventricular Rate:  92 PR Interval:    QRS Duration: 100 QT Interval:  361 QTC Calculation: 447 R Axis:   -21 Text Interpretation:  Sinus rhythm Borderline left axis deviation Low voltage, precordial leads Consider anterior infarct Confirmed by Lita Mains  MD, Chesni Vos ([id number]) on 02/27/2017 1:02:54 PM       Radiology No results found.  Procedures Procedures (including critical care time)  Medications Ordered in ED Medications  nitroGLYCERIN (NITROSTAT) SL tablet 0.4 mg (0.4 mg Sublingual Given 02/27/17 1314)  morphine 2 MG/ML injection 2 mg (not  administered)  heparin bolus via infusion 4,000 Units (not administered)  aspirin chewable tablet 324 mg (324 mg Oral Given 02/27/17 1306)  ondansetron (ZOFRAN) injection 4 mg (4 mg Intravenous Given 02/27/17 1319)     Initial Impression / Assessment and Plan / ED Course  I have reviewed the triage vital signs and the nursing notes.  Pertinent labs & imaging results that were available during my care of the patient were reviewed by me and considered in my medical decision making (see chart for details).    Given nitroglycerin sublingual 3 and 325 mg of aspirin. Patient's pain is significantly improved after nitroglycerin. Discussed with cardiology and will see patient in the emergency department and disposition. Will start on heparin and give dose of morphine.  Final Clinical Impressions(s) / ED Diagnoses   Final diagnoses:  Chest pain, unspecified type    New Prescriptions New Prescriptions   No medications on file     Julianne Rice, MD 02/27/17 1346

## 2017-02-27 NOTE — ED Triage Notes (Signed)
Pt reports having a chemical stress test this morning at the [facility] and began having crushing chest pain with a choking sensation on the way home.

## 2017-02-27 NOTE — Progress Notes (Signed)
ANTICOAGULATION CONSULT NOTE - Initial Consult  Pharmacy Consult for Heparin Indication: chest pain/ACS  Allergies  Allergen Reactions  . Effexor [Venlafaxine] Other (See Comments)    Night sweats    Patient Measurements: Height: 6\' 1"  (185.4 cm) Weight: 250 lb (113.4 kg) IBW/kg (Calculated) : 79.9 HEPARIN DW (KG): 103.9  Vital Signs: Temp: 98.1 F (36.7 C) (03/05 1235) Temp Source: Oral (03/05 1235) BP: 127/100 (03/05 1515) Pulse Rate: 85 (03/05 1515)  Labs:  Recent Labs  02/27/17 1243 02/27/17 1346  HGB 15.5  --   HCT 44.2  --   PLT 193  --   LABPROT  --  12.7  INR  --  0.95  CREATININE 0.99  --   TROPONINI 0.07* 0.08*    Estimated Creatinine Clearance: 112.6 mL/min (by C-G formula based on SCr of 0.99 mg/dL).   Medical History: Past Medical History:  Diagnosis Date  . Chronic pain syndrome   . Diabetes mellitus without complication ([place])   . Diabetic neuropathy ([place])   . Fibromyalgia   . GERD (gastroesophageal reflux disease)   . Hypertension   . Left retinopathy of prematurity   . Sleep apnea   . Varicose veins of both lower extremities     Medications:  See med rec  Assessment:  56 y.o.male history of HTN, DM2 presents with chest pain. He is regularly followed at the [facility] in [place], recently transferred to the [facility]. He reports a recent diagnosis of afib, but has not started anticoagulation.EKG without specific ischemic changes, initial troponin mildly elevated at 0.07. Pharmacy asked to start heparin infusion  Goal of Therapy:  Heparin level 0.3-0.7 units/ml Monitor platelets by anticoagulation protocol: Yes   Plan:  Give 4000 units bolus x 1 Start heparin infusion at 1300 units/hr Check anti-Xa level in 6 hours and daily while on heparin Continue to monitor H&H and platelets  Trenton Gammon, Mayvis Agudelo L 02/27/2017,3:21 PM

## 2017-02-28 ENCOUNTER — Inpatient Hospital Stay (HOSPITAL_COMMUNITY): Payer: Non-veteran care

## 2017-02-28 ENCOUNTER — Encounter (HOSPITAL_COMMUNITY)
Admission: EM | Disposition: A | Discharge status: Home / Self Care | Payer: Self-pay | Source: Home / Self Care | Attending: Cardiovascular Disease

## 2017-02-28 DIAGNOSIS — R7989 Other specified abnormal findings of blood chemistry: Secondary | ICD-10-CM

## 2017-02-28 DIAGNOSIS — R778 Other specified abnormalities of plasma proteins: Secondary | ICD-10-CM

## 2017-02-28 DIAGNOSIS — R748 Abnormal levels of other serum enzymes: Secondary | ICD-10-CM

## 2017-02-28 DIAGNOSIS — I272 Pulmonary hypertension, unspecified: Secondary | ICD-10-CM

## 2017-02-28 DIAGNOSIS — R079 Chest pain, unspecified: Secondary | ICD-10-CM

## 2017-02-28 HISTORY — PX: RIGHT/LEFT HEART CATH AND CORONARY ANGIOGRAPHY: CATH118266

## 2017-02-28 LAB — BASIC METABOLIC PANEL
Anion gap: 7 (ref 5–15)
BUN: 14 mg/dL (ref 6–20)
CHLORIDE: 108 mmol/L (ref 101–111)
CO2: 24 mmol/L (ref 22–32)
CREATININE: 1.1 mg/dL (ref 0.61–1.24)
Calcium: 9.2 mg/dL (ref 8.9–10.3)
GFR calc non Af Amer: >60 mL/min (ref >60)
GLUCOSE: 190 mg/dL — AB (ref 65–99)
Potassium: 3.8 mmol/L (ref 3.5–5.1)
Sodium: 139 mmol/L (ref 135–145)

## 2017-02-28 LAB — CBC
HCT: 42.9 % (ref 39.0–52.0)
HEMOGLOBIN: 14.7 g/dL (ref 13.0–17.0)
MCH: 30.3 pg (ref 26.0–34.0)
MCHC: 34.3 g/dL (ref 30.0–36.0)
MCV: 88.5 fL (ref 78.0–100.0)
Platelets: 177 10*3/uL (ref 150–400)
RBC: 4.85 MIL/uL (ref 4.22–5.81)
RDW: 13.4 % (ref 11.5–15.5)
WBC: 7.4 10*3/uL (ref 4.0–10.5)

## 2017-02-28 LAB — POCT I-STAT 3, VENOUS BLOOD GAS (G3P V)
ACID-BASE EXCESS: 1 mmol/L (ref 0.0–2.0)
BICARBONATE: 25.3 mmol/L (ref 20.0–28.0)
O2 Saturation: 70 %
PO2 VEN: 36 mmHg (ref 32.0–45.0)
TCO2: 26 mmol/L (ref 0–100)
pCO2, Ven: 38.3 mmHg — ABNORMAL LOW (ref 44.0–60.0)
pH, Ven: 7.428 (ref 7.250–7.430)

## 2017-02-28 LAB — LIPID PANEL
CHOL/HDL RATIO: 5.8 ratio
Cholesterol: 169 mg/dL (ref 0–200)
HDL: 29 mg/dL — ABNORMAL LOW (ref >40)
LDL CALC: UNDETERMINED mg/dL (ref 0–99)
Triglycerides: 469 mg/dL — ABNORMAL HIGH (ref <150)
VLDL: UNDETERMINED mg/dL (ref 0–40)

## 2017-02-28 LAB — POCT ACTIVATED CLOTTING TIME: Activated Clotting Time: 169 seconds

## 2017-02-28 LAB — POCT I-STAT 3, ART BLOOD GAS (G3+)
Acid-base deficit: 2 mmol/L (ref 0.0–2.0)
BICARBONATE: 21.6 mmol/L (ref 20.0–28.0)
O2 Saturation: 96 %
PCO2 ART: 31.3 mmHg — AB (ref 32.0–48.0)
TCO2: 23 mmol/L (ref 0–100)
pH, Arterial: 7.447 (ref 7.350–7.450)
pO2, Arterial: 75 mmHg — ABNORMAL LOW (ref 83.0–108.0)

## 2017-02-28 LAB — HIV ANTIBODY (ROUTINE TESTING W REFLEX): HIV Screen 4th Generation wRfx: NONREACTIVE

## 2017-02-28 LAB — TROPONIN I

## 2017-02-28 LAB — GLUCOSE, CAPILLARY
GLUCOSE-CAPILLARY: 178 mg/dL — AB (ref 65–99)
GLUCOSE-CAPILLARY: 99 mg/dL (ref 65–99)
Glucose-Capillary: 111 mg/dL — ABNORMAL HIGH (ref 65–99)
Glucose-Capillary: 189 mg/dL — ABNORMAL HIGH (ref 65–99)

## 2017-02-28 LAB — HEPARIN LEVEL (UNFRACTIONATED)
Heparin Unfractionated: 0.11 IU/mL — ABNORMAL LOW (ref 0.30–0.70)
Heparin Unfractionated: 0.4 IU/mL (ref 0.30–0.70)

## 2017-02-28 SURGERY — RIGHT/LEFT HEART CATH AND CORONARY ANGIOGRAPHY

## 2017-02-28 MED ORDER — VERAPAMIL HCL 2.5 MG/ML IV SOLN
INTRAVENOUS | Status: AC
  Filled 2017-02-28: qty 2

## 2017-02-28 MED ORDER — SODIUM CHLORIDE 0.9% FLUSH
3.0000 mL | Freq: Two times a day (BID) | INTRAVENOUS | Status: DC
  Administered 2017-02-28 – 2017-03-01 (×2): 3 mL via INTRAVENOUS

## 2017-02-28 MED ORDER — FENOFIBRATE 160 MG PO TABS
160.0000 mg | ORAL_TABLET | Freq: Every day | ORAL | Status: DC
  Administered 2017-02-28 – 2017-03-01 (×2): 160 mg via ORAL
  Filled 2017-02-28 (×2): qty 1

## 2017-02-28 MED ORDER — VERAPAMIL HCL 2.5 MG/ML IV SOLN
INTRAVENOUS | Status: DC | PRN
  Administered 2017-02-28: 10 mL via INTRA_ARTERIAL

## 2017-02-28 MED ORDER — LIDOCAINE HCL (PF) 1 % IJ SOLN
INTRAMUSCULAR | Status: DC | PRN
  Administered 2017-02-28 (×2): 2 mL

## 2017-02-28 MED ORDER — MIDAZOLAM HCL 2 MG/2ML IJ SOLN
INTRAMUSCULAR | Status: AC
  Filled 2017-02-28: qty 2

## 2017-02-28 MED ORDER — SODIUM CHLORIDE 0.9 % IV SOLN: 250.0000 mL | INTRAVENOUS | Status: DC | PRN

## 2017-02-28 MED ORDER — ONDANSETRON HCL 4 MG/2ML IJ SOLN: 4.0000 mg | Freq: Four times a day (QID) | INTRAMUSCULAR | Status: DC | PRN

## 2017-02-28 MED ORDER — HEPARIN SODIUM (PORCINE) 1000 UNIT/ML IJ SOLN
INTRAMUSCULAR | Status: AC
  Filled 2017-02-28: qty 1

## 2017-02-28 MED ORDER — SODIUM CHLORIDE 0.9 % IV SOLN: INTRAVENOUS | Status: AC

## 2017-02-28 MED ORDER — IOPAMIDOL (ISOVUE-370) INJECTION 76%
INTRAVENOUS | Status: AC
  Filled 2017-02-28: qty 100

## 2017-02-28 MED ORDER — LIDOCAINE HCL (PF) 1 % IJ SOLN
INTRAMUSCULAR | Status: AC
Start: 2017-02-28 — End: 2017-02-28
  Filled 2017-02-28: qty 30

## 2017-02-28 MED ORDER — ACETAMINOPHEN 325 MG PO TABS: 650.0000 mg | ORAL_TABLET | ORAL | Status: DC | PRN

## 2017-02-28 MED ORDER — ATORVASTATIN CALCIUM 40 MG PO TABS
40.0000 mg | ORAL_TABLET | Freq: Every day | ORAL | Status: DC
Start: 2017-02-28 — End: 2017-02-28

## 2017-02-28 MED ORDER — HEPARIN (PORCINE) IN NACL 2-0.9 UNIT/ML-% IJ SOLN
INTRAMUSCULAR | Status: AC
  Filled 2017-02-28: qty 1000

## 2017-02-28 MED ORDER — IOPAMIDOL (ISOVUE-370) INJECTION 76%
INTRAVENOUS | Status: DC | PRN
  Administered 2017-02-28: 190 mL via INTRA_ARTERIAL

## 2017-02-28 MED ORDER — FENTANYL CITRATE (PF) 100 MCG/2ML IJ SOLN
INTRAMUSCULAR | Status: AC
  Filled 2017-02-28: qty 2

## 2017-02-28 MED ORDER — HEPARIN SODIUM (PORCINE) 5000 UNIT/ML IJ SOLN
5000.0000 [IU] | Freq: Three times a day (TID) | INTRAMUSCULAR | Status: DC
  Administered 2017-03-01: 5000 [IU] via SUBCUTANEOUS
  Filled 2017-02-28: qty 1

## 2017-02-28 MED ORDER — ADENOSINE (PAH - CATH LAB) SYRINGE(S)
120.0000 mL | Freq: Once | INTRAVENOUS | Status: DC
  Filled 2017-02-28: qty 120

## 2017-02-28 MED ORDER — FENTANYL CITRATE (PF) 100 MCG/2ML IJ SOLN
INTRAMUSCULAR | Status: DC | PRN
  Administered 2017-02-28 (×2): 25 ug via INTRAVENOUS

## 2017-02-28 MED ORDER — SIMVASTATIN 20 MG PO TABS: 20.0000 mg | ORAL_TABLET | Freq: Every day | ORAL | Status: DC

## 2017-02-28 MED ORDER — IOPAMIDOL (ISOVUE-370) INJECTION 76%
INTRAVENOUS | Status: AC
  Filled 2017-02-28: qty 50

## 2017-02-28 MED ORDER — HEPARIN SODIUM (PORCINE) 1000 UNIT/ML IJ SOLN
INTRAMUSCULAR | Status: DC | PRN
  Administered 2017-02-28: 5000 [IU] via INTRAVENOUS

## 2017-02-28 MED ORDER — HEPARIN (PORCINE) IN NACL 2-0.9 UNIT/ML-% IJ SOLN
INTRAMUSCULAR | Status: DC | PRN
  Administered 2017-02-28: 1000 mL

## 2017-02-28 MED ORDER — MIDAZOLAM HCL 2 MG/2ML IJ SOLN
INTRAMUSCULAR | Status: DC | PRN
  Administered 2017-02-28: 2 mg via INTRAVENOUS
  Administered 2017-02-28: 1 mg via INTRAVENOUS

## 2017-02-28 MED ORDER — SODIUM CHLORIDE 0.9% FLUSH: 3.0000 mL | INTRAVENOUS | Status: DC | PRN

## 2017-02-28 SURGICAL SUPPLY — 17 items
CATH 5FR JL3.5 JR4 ANG PIG MP (CATHETERS) ×2 IMPLANT
CATH BALLN WEDGE 5F 110CM (CATHETERS) ×2 IMPLANT
CATH INFINITI 5 FR 3DRC (CATHETERS) ×2 IMPLANT
CATH LAUNCHER 5F EBU3.0 (CATHETERS) ×1 IMPLANT
CATHETER LAUNCHER 5F EBU3.0 (CATHETERS) ×2
DEVICE RAD COMP TR BAND LRG (VASCULAR PRODUCTS) ×2 IMPLANT
GLIDESHEATH SLEND SS 6F .021 (SHEATH) ×2 IMPLANT
GUIDEWIRE INQWIRE 1.5J.035X260 (WIRE) ×1 IMPLANT
INQWIRE 1.5J .035X260CM (WIRE) ×2
KIT HEART LEFT (KITS) ×2 IMPLANT
PACK CARDIAC CATHETERIZATION (CUSTOM PROCEDURE TRAY) ×2 IMPLANT
SHEATH GLIDE SLENDER 4/5FR (SHEATH) ×2 IMPLANT
SYR MEDRAD MARK V 150ML (SYRINGE) ×2 IMPLANT
TRANSDUCER W/STOPCOCK (MISCELLANEOUS) ×2 IMPLANT
TUBING ART PRESS 72  MALE/FEM (TUBING) ×1
TUBING ART PRESS 72 MALE/FEM (TUBING) ×1 IMPLANT
TUBING CIL FLEX 10 FLL-RA (TUBING) ×2 IMPLANT

## 2017-02-28 NOTE — Progress Notes (Signed)
Progress Note  Patient Name: Steve Bryan Date of Encounter: 02/28/2017  Primary Cardiologist: [facility]    Scheduled Meds: . allopurinol  300 mg Oral Daily  . amLODipine  10 mg Oral Daily  . aspirin  81 mg Oral Once  . aspirin EC  81 mg Oral Daily  . DULoxetine  60 mg Oral Daily  . heparin  4,000 Units Intravenous Once  . insulin aspart  0-5 Units Subcutaneous QHS  . insulin aspart  0-9 Units Subcutaneous TID WC  . insulin glargine  30 Units Subcutaneous QHS  . multivitamin with minerals  1 tablet Oral Daily  . pantoprazole  40 mg Oral Daily  . sodium chloride flush  3 mL Intravenous Q12H  . tamsulosin  0.4 mg Oral Daily   Continuous Infusions: . sodium chloride 1 mL/kg/hr (02/28/17 0700)  . heparin 1,900 Units/hr (02/28/17 0700)   PRN Meds: sodium chloride, acetaminophen, methocarbamol, nitroGLYCERIN, ondansetron (ZOFRAN) IV, sodium chloride flush, traMADol   Vital Signs    Vitals:   02/27/17 1800 02/27/17 1930 02/28/17 0400 02/28/17 0700  BP: 131/74   127/84  Pulse: 80   70  Resp: 17   19  Temp:  97.9 F (36.6 C) 97.7 F (36.5 C)   TempSrc:  Oral Oral   SpO2: 97%   94%  Weight:  250 lb (113.4 kg) 250 lb (113.4 kg)   Height:  6\' 1"  (1.854 m)      Intake/Output Summary (Last 24 hours) at 02/28/17 0910 Last data filed at 02/28/17 0400  Gross per 24 hour  Intake                0 ml  Output              800 ml  Net             -800 ml   Filed Weights   02/27/17 1234 02/27/17 1930 02/28/17 0400  Weight: 250 lb (113.4 kg) 250 lb (113.4 kg) 250 lb (113.4 kg)    Telemetry    SR - Personally Reviewed  ECG    SR with TWI in lead III - Personally Reviewed  Physical Exam   General: Well developed, well nourished, male appearing in no acute distress. Head: Normocephalic, atraumatic.  Neck: Supple without bruits, JVD. Lungs:  Resp regular and unlabored, CTA. Heart: RRR, S1, S2, no S3, S4, or murmur; no rub. Abdomen: Soft, non-tender, non-distended  with normoactive bowel sounds. No hepatomegaly. No rebound/guarding. No obvious abdominal masses. Extremities: No clubbing, cyanosis, edema. Distal pedal pulses are 2+ bilaterally. Neuro: Alert and oriented X 3. Moves all extremities spontaneously. Psych: Normal affect.  Labs    Chemistry Recent Labs Lab 02/27/17 1243 02/28/17 0238  NA 137 139  K 4.2 3.8  CL 107 108  CO2 21* 24  GLUCOSE 226* 190*  BUN 17 14  CREATININE 0.99 1.10  CALCIUM 9.4 9.2  PROT 6.9  --   ALBUMIN 4.2  --   AST 31  --   ALT 50  --   ALKPHOS 67  --   BILITOT 0.4  --   GFRNONAA >60 >60  GFRAA >60 >60  ANIONGAP 9 7     Hematology Recent Labs Lab 02/27/17 1243 02/28/17 0238  WBC 8.2 7.4  RBC 5.06 4.85  HGB 15.5 14.7  HCT 44.2 42.9  MCV 87.4 88.5  MCH 30.6 30.3  MCHC 35.1 34.3  RDW 13.1 13.4  PLT 193  177    Cardiac Enzymes Recent Labs Lab 02/27/17 1243 02/27/17 1346 02/27/17 2102 02/28/17 0238  TROPONINI 0.07* 0.08* 0.04* <0.03   No results for input(s): TROPIPOC in the last 168 hours.   BNPNo results for input(s): BNP, PROBNP in the last 168 hours.   DDimer No results for input(s): DDIMER in the last 168 hours.    Radiology    Dg Chest 2 View  Result Date: 02/27/2017 CLINICAL DATA:  Chest pain during and after a cardiac stress test today. History of pulmonary hypertension, atrial fibrillation, diabetes, nonsmoker. EXAM: CHEST  2 VIEW COMPARISON:  PA and lateral chest x-ray of November 07, 2007 FINDINGS: The lungs are mildly hypoinflated but clear. There is no pleural effusion, pneumothorax, or pneumomediastinum. The cardiac silhouette is mildly enlarged. The pulmonary vascularity is normal. The trachea is midline. The bony thorax exhibits no acute abnormality. IMPRESSION: Mild hypoinflation.  No acute cardiopulmonary abnormality. Electronically Signed   By: David  Martinique M.D.   On: 02/27/2017 14:09    Cardiac Studies   N/A  Patient Profile     56 y.o. male with PMH of HTN,  DM, and PAF who presented to [facility] with dyspnea and chest pain. Transferred to [facility] for further work up.   Assessment & Plan    1. Chest pain: Multiple risk factors for CAD. EKG non acute. Trop with mild elevation, peak at 0.08.  -- remains on IV heparin, no reports of chest pain this morning. Planned for [facility] today. All questions answers and he is agreeable to this plan.   2. PAF: recently dx. States he wore an event monitor and recently completed but has not received the results yet. Was not started on [place].  -- No afib on telemetry. On IV heparin for ACS. ChadsVasc of 2 at this time.   3. Pulmonary HTN: States this is new dx for for him per echo that was done by the [facility]. PA pressures were reported elevated and told his pulmonary artery is enlarged. Continues to have intermittent episodes of dyspnea. Planned for [facility]HC today.  -- echo pending  4. HTN: Borderline controlled. On amlodipine. Consider the addition of BB after cath today.   5. HL: Trig 469, HDL 29. Will add fenofibrate.   6. DM: SSI  Signed, Reino Bellis, NP  02/28/2017, 9:10 AM    Attending Note:   The patient was seen and examined.  Agree with assessment and plan as noted above.  Changes made to the above note as needed.  Patient seen and independently examined with Reino Bellis, NP .   We discussed all aspects of the encounter. I agree with the assessment and plan as stated above.  1. Pulmonary HTN:   Has had the dx for years  Had an episode of pulmonary hemorrhage several years ago .   Still has not been explained. May be related to this episode.   2. Abnormal dobutamine stress echo:   I do not have the results but the doctor said that he needed a R heart cath immediatley. He left the [facility] and was headed home.   Developed CP and stopped at [facility] . Was transferred here for further eval.      I have spent a total of 40 minutes with patient reviewing hospital  notes , telemetry,  EKGs, labs and examining patient as well as establishing an assessment and plan that was discussed with the patient. > 50% of time was spent in direct  patient care.    Thayer Headings, Brooke Bonito., MD, [facility] 02/28/2017, 10:09 AM [address]  [place] Pager [phone]

## 2017-02-28 NOTE — H&P (View-Only) (Signed)
Progress Note  Patient Name: Steve Bryan Date of Encounter: 02/28/2017  [facility] Cardiologist[facility] [place]    Scheduled Meds: . allopurinol  300 mg Oral Daily  . amLODipine  10 mg Oral Daily  . aspirin  81 mg Oral Once  . aspirin EC  81 mg Oral Daily  . DULoxetine  60 mg Oral Daily  . heparin  4,000 Units Intravenous Once  . insulin aspart  0-5 Units Subcutaneous QHS  . insulin aspart  0-9 Units Subcutaneous TID WC  . insulin glargine  30 Units Subcutaneous QHS  . multivitamin with minerals  1 tablet Oral Daily  . pantoprazole  40 mg Oral Daily  . sodium chloride flush  3 mL Intravenous Q12H  . tamsulosin  0.4 mg Oral Daily   Continuous Infusions: . sodium chloride 1 mL/kg/hr (02/28/17 0700)  . heparin 1,900 Units/hr (02/28/17 0700)   PRN Meds: sodium chloride, acetaminophen, methocarbamol, nitroGLYCERIN, ondansetron (ZOFRAN) IV, sodium chloride flush, traMADol   Vital Signs    Vitals:   02/27/17 1800 02/27/17 1930 02/28/17 0400 02/28/17 0700  BP: 131/74   127/84  Pulse: 80   70  Resp: 17   19  Temp:  97.9 F (36.6 C) 97.7 F (36.5 C)   TempSrc:  Oral Oral   SpO2: 97%   94%  Weight:  250 lb (113.4 kg) 250 lb (113.4 kg)   Height:  6\' 1"  (1.854 m)      Intake/Output Summary (Last 24 hours) at 02/28/17 0910 Last data filed at 02/28/17 0400  Gross per 24 hour  Intake                0 ml  Output              800 ml  Net             -800 ml   Filed Weights   02/27/17 1234 02/27/17 1930 02/28/17 0400  Weight: 250 lb (113.4 kg) 250 lb (113.4 kg) 250 lb (113.4 kg)    Telemetry    SR - Personally Reviewed  ECG    SR with TWI in lead III - Personally Reviewed  Physical Exam   General: Well developed, well nourished, male appearing in no acute distress. Head: Normocephalic, atraumatic.  Neck: Supple without bruits, JVD. Lungs:  Resp regular and unlabored, CTA. Heart: RRR, S1, S2, no S3, S4, or murmur; no rub. Abdomen: Soft, non-tender, non-distended  with normoactive bowel sounds. No hepatomegaly. No rebound/guarding. No obvious abdominal masses. Extremities: No clubbing, cyanosis, edema. Distal pedal pulses are 2+ bilaterally. Neuro: Alert and oriented X 3. Moves all extremities spontaneously. Psych: Normal affect.  Labs    Chemistry Recent Labs Lab 02/27/17 1243 02/28/17 0238  NA 137 139  K 4.2 3.8  CL 107 108  CO2 21* 24  GLUCOSE 226* 190*  BUN 17 14  CREATININE 0.99 1.10  CALCIUM 9.4 9.2  PROT 6.9  --   ALBUMIN 4.2  --   AST 31  --   ALT 50  --   ALKPHOS 67  --   BILITOT 0.4  --   GFRNONAA >60 >60  GFRAA >60 >60  ANIONGAP 9 7     Hematology Recent Labs Lab 02/27/17 1243 02/28/17 0238  WBC 8.2 7.4  RBC 5.06 4.85  HGB 15.5 14.7  HCT 44.2 42.9  MCV 87.4 88.5  MCH 30.6 30.3  MCHC 35.1 34.3  RDW 13.1 13.4  PLT 193  177    Cardiac Enzymes Recent Labs Lab 02/27/17 1243 02/27/17 1346 02/27/17 2102 02/28/17 0238  TROPONINI 0.07* 0.08* 0.04* <0.03   No results for input(s): TROPIPOC in the last 168 hours.   BNPNo results for input(s): BNP, PROBNP in the last 168 hours.   DDimer No results for input(s): DDIMER in the last 168 hours.    Radiology    Dg Chest 2 View  Result Date: 02/27/2017 CLINICAL DATA:  Chest pain during and after a cardiac stress test today. History of pulmonary hypertension, atrial fibrillation, diabetes, nonsmoker. EXAM: CHEST  2 VIEW COMPARISON:  PA and lateral chest x-ray of November 07, 2007 FINDINGS: The lungs are mildly hypoinflated but clear. There is no pleural effusion, pneumothorax, or pneumomediastinum. The cardiac silhouette is mildly enlarged. The pulmonary vascularity is normal. The trachea is midline. The bony thorax exhibits no acute abnormality. IMPRESSION: Mild hypoinflation.  No acute cardiopulmonary abnormality. Electronically Signed   By: David  Martinique M.D.   On: 02/27/2017 14:09    Cardiac Studies   N/A  Patient Profile     56 y.o. male with PMH of HTN,  DM, and PAF who presented to [facility] with dyspnea and chest pain. Transferred to [facility] for further work up.   Assessment & Plan    1. Chest pain: Multiple risk factors for CAD. EKG non acute. Trop with mild elevation, peak at 0.08.  -- remains on IV heparin, no reports of chest pain this morning. Planned for [facility] today. All questions answers and he is agreeable to this plan.   2. PAF: recently dx. States he wore an event monitor and recently completed but has not received the results yet. Was not started on [place].  -- No afib on telemetry. On IV heparin for ACS. ChadsVasc of 2 at this time.   3. Pulmonary HTN: States this is new dx for for him per echo that was done by the [place]. PA pressures were reported elevated and told his pulmonary artery is enlarged. Continues to have intermittent episodes of dyspnea. Planned for RHC today.  -- echo pending  4. HTN: Borderline controlled. On amlodipine. Consider the addition of BB after cath today.   5. HL: Trig 469, HDL 29. Will add fenofibrate.   6. DM: SSI  Signed, Reino Bellis, NP  02/28/2017, 9:10 AM    Attending Note:   The patient was seen and examined.  Agree with assessment and plan as noted above.  Changes made to the above note as needed.  Patient seen and independently examined with Reino Bellis, NP .   We discussed all aspects of the encounter. I agree with the assessment and plan as stated above.  1. Pulmonary HTN:   Has had the dx for years  Had an episode of pulmonary hemorrhage several years ago .   Still has not been explained. May be related to this episode.   2. Abnormal dobutamine stress echo:   I do not have the results but the doctor said that he needed a R heart cath immediatley. He left the [facility] and was headed home.   Developed CP and stopped at [facility] . Was transferred here for further eval.      I have spent a total of 40 minutes with patient reviewing hospital  notes , telemetry,  EKGs, labs and examining patient as well as establishing an assessment and plan that was discussed with the patient. > 50% of time was spent in direct  patient care.    Thayer Headings, Brooke Bonito., MD, [facility] 02/28/2017, 10:09 AM [address],  [place] Pager [phone]

## 2017-02-28 NOTE — Progress Notes (Signed)
ANTICOAGULATION CONSULT NOTE - Follow Up Consult  Pharmacy Consult for Heparin  Indication: chest pain/ACS  Allergies  Allergen Reactions  . Effexor [Venlafaxine] Other (See Comments)    Night sweats   Patient Measurements: Height: 6\' 1"  (185.4 cm) Weight: 250 lb (113.4 kg) IBW/kg (Calculated) : 79.9  Vital Signs: Temp: 98.5 F (36.9 C) (03/06 1300) Temp Source: Oral (03/06 1300) BP: 128/74 (03/06 1300) Pulse Rate: 76 (03/06 1300)  Labs:  Recent Labs  02/27/17 1243 02/27/17 1346 02/27/17 2102 02/28/17 0238 02/28/17 0906 02/28/17 1124  HGB 15.5  --   --  14.7  --   --   HCT 44.2  --   --  42.9  --   --   PLT 193  --   --  177  --   --   LABPROT  --  12.7  --   --   --   --   INR  --  0.95  --   --   --   --   HEPARINUNFRC  --   --  <0.10* 0.11*  --  0.40  CREATININE 0.99  --   --  1.10  --   --   TROPONINI 0.07* 0.08* 0.04* <0.03 <0.03  --     Estimated Creatinine Clearance: 101.3 mL/min (by C-G formula based on SCr of 1.1 mg/dL).  Assessment: Heparin for CP. Heparin level at goal this afternoon, no bleeding issues noted.   Goal of Therapy:  Heparin level 0.3-0.7 units/ml Monitor platelets by anticoagulation protocol: Yes   Plan:  -heparin at 1900 units/hr -Daily HL/CBC -Plan for cath this afternoon  Erin Hearing PharmD., BCPS Clinical Pharmacist Pager [phone] 02/28/2017 2:20 PM

## 2017-02-28 NOTE — Interval H&P Note (Signed)
Cath Lab Visit (complete for each Cath Lab visit)  Clinical Evaluation Leading to the Procedure:   ACS: Yes.    Non-ACS:    Anginal Classification: CCS IV  Anti-ischemic medical therapy: Minimal Therapy (1 class of medications)  Non-Invasive Test Results: Intermediate-risk stress test findings: cardiac mortality 1-3%/year  Prior CABG: No previous CABG   Dobutamine stress echo at OSH.  Was told it was abnormal.    History and Physical Interval Note:  02/28/2017 5:04 PM  Steve Bryan  has presented today for surgery, with the diagnosis of cp  The various methods of treatment have been discussed with the patient and family. After consideration of risks, benefits and other options for treatment, the patient has consented to  Procedure(s): Right/Left Heart Cath and Coronary Angiography (N/A) as a surgical intervention .  The patient's history has been reviewed, patient examined, no change in status, stable for surgery.  I have reviewed the patient's chart and labs.  Questions were answered to the patient's satisfaction.     Steve Bryan

## 2017-02-28 NOTE — Progress Notes (Signed)
ANTICOAGULATION CONSULT NOTE - Follow Up Consult  Pharmacy Consult for Heparin  Indication: chest pain/ACS  Allergies  Allergen Reactions  . Effexor [Venlafaxine] Other (See Comments)    Night sweats   Patient Measurements: Height: 6\' 1"  (185.4 cm) Weight: 250 lb (113.4 kg) IBW/kg (Calculated) : 79.9  Vital Signs: Temp: 97.9 F (36.6 C) (03/05 1930) Temp Source: Oral (03/05 1930) BP: 131/74 (03/05 1800) Pulse Rate: 80 (03/05 1800)  Labs:  Recent Labs  02/27/17 1243 02/27/17 1346 02/27/17 2102 02/28/17 0238  HGB 15.5  --   --  14.7  HCT 44.2  --   --  42.9  PLT 193  --   --  177  LABPROT  --  12.7  --   --   INR  --  0.95  --   --   HEPARINUNFRC  --   --  <0.10* 0.11*  CREATININE 0.99  --   --  1.10  TROPONINI 0.07* 0.08* 0.04* <0.03    Estimated Creatinine Clearance: 101.3 mL/min (by C-G formula based on SCr of 1.1 mg/dL).  Assessment: Heparin for CP, heparin level 4.5 hours after rate change remains low at 0.11, will increase heparin some, no issues per RN.  Goal of Therapy:  Heparin level 0.3-0.7 units/ml Monitor platelets by anticoagulation protocol: Yes   Plan:  -Inc heparin to 1900 units/hr -1200 HL  Narda Bonds 02/28/2017,3:24 AM

## 2017-03-01 ENCOUNTER — Inpatient Hospital Stay (HOSPITAL_COMMUNITY): Payer: Non-veteran care

## 2017-03-01 ENCOUNTER — Encounter (HOSPITAL_COMMUNITY): Payer: Self-pay | Admitting: Interventional Cardiology

## 2017-03-01 DIAGNOSIS — E781 Pure hyperglyceridemia: Secondary | ICD-10-CM

## 2017-03-01 DIAGNOSIS — I361 Nonrheumatic tricuspid (valve) insufficiency: Secondary | ICD-10-CM

## 2017-03-01 LAB — GLUCOSE, CAPILLARY
GLUCOSE-CAPILLARY: 142 mg/dL — AB (ref 65–99)
GLUCOSE-CAPILLARY: 202 mg/dL — AB (ref 65–99)

## 2017-03-01 LAB — CBC
HEMATOCRIT: 42.2 % (ref 39.0–52.0)
Hemoglobin: 14.1 g/dL (ref 13.0–17.0)
MCH: 29.6 pg (ref 26.0–34.0)
MCHC: 33.4 g/dL (ref 30.0–36.0)
MCV: 88.5 fL (ref 78.0–100.0)
Platelets: 174 10*3/uL (ref 150–400)
RBC: 4.77 MIL/uL (ref 4.22–5.81)
RDW: 13.3 % (ref 11.5–15.5)
WBC: 6.1 10*3/uL (ref 4.0–10.5)

## 2017-03-01 LAB — ECHOCARDIOGRAM COMPLETE
E decel time: 137 msec
E/e' ratio: 6.32
FS: 31 % (ref 28–44)
HEIGHTINCHES: 73 in
IVS/LV PW RATIO, ED: 0.76
LA ID, A-P, ES: 51 mm
LA diam end sys: 51 mm
LA vol A4C: 80.5 ml
LA vol index: 37.8 mL/m2
LA vol: 89.1 mL
LADIAMINDEX: 2.16 cm/m2
LDCA: 3.8 cm2
LV E/e'average: 6.32
LV TDI E'LATERAL: 12.2
LV TDI E'MEDIAL: 9.21
LV e' LATERAL: 12.2 cm/s
LVEEMED: 6.32
LVOT SV: 69 mL
LVOT VTI: 18.1 cm
LVOT diameter: 22 mm
LVOTPV: 85.8 cm/s
Lateral S' vel: 13.5 cm/s
MV Dec: 137
MV pk E vel: 77.1 m/s
MVPG: 2 mmHg
MVPKAVEL: 67.3 m/s
PW: 12.6 mm — AB (ref 0.6–1.1)
RV TAPSE: 27.7 mm
Weight: 3992 oz

## 2017-03-01 LAB — HEMOGLOBIN A1C
HEMOGLOBIN A1C: 7.4 % — AB (ref 4.8–5.6)
Mean Plasma Glucose: 166 mg/dL

## 2017-03-01 LAB — D-DIMER, QUANTITATIVE (NOT AT ARMC): D DIMER QUANT: 0.4 ug{FEU}/mL (ref 0.00–0.50)

## 2017-03-01 MED ORDER — LOSARTAN POTASSIUM 25 MG PO TABS
25.0000 mg | ORAL_TABLET | Freq: Every day | ORAL | Status: DC
  Administered 2017-03-01: 25 mg via ORAL
  Filled 2017-03-01: qty 1

## 2017-03-01 MED ORDER — ASPIRIN 81 MG PO TBEC: 81.0000 mg | DELAYED_RELEASE_TABLET | Freq: Every day | ORAL | Status: AC

## 2017-03-01 MED ORDER — FENOFIBRATE 160 MG PO TABS: 160.0000 mg | ORAL_TABLET | Freq: Every day | ORAL | 6 refills | Status: DC

## 2017-03-01 MED ORDER — LOSARTAN POTASSIUM 25 MG PO TABS: 25.0000 mg | ORAL_TABLET | Freq: Every day | ORAL | 6 refills | Status: DC

## 2017-03-01 NOTE — Progress Notes (Signed)
  Echocardiogram 2D Echocardiogram has been performed.  Steve Bryan 03/01/2017, 11:28 AM

## 2017-03-01 NOTE — Progress Notes (Signed)
Discharge instructions reviewed with pt. Pt has no questions at this time. Prescriptions given to pt. Pt has no complaints of pain. Right radial site level 0. Discussed with pt on how to use My Chart. IV d.c

## 2017-03-01 NOTE — Discharge Summary (Signed)
Discharge Summary    Patient ID: Steve Bryan,  MRN: [id number], DOB/AGE: [birth year] 56 y.o.  Admit date: 02/27/2017 Discharge date: 03/01/2017  Primary Care Provider: [facility] Primary Cardiologist: [facility])  Discharge Diagnoses    Active Problems:   NSTEMI (non-ST elevated myocardial infarction) [facility])   Chest pain   Essential hypertension   Elevated troponin   Hypertriglyceridemia   Allergies Allergies  Allergen Reactions  . Effexor [Venlafaxine] Other (See Comments)    Night sweats    Diagnostic Studies/Procedures    R/LHC: 02/28/17     Mid RCA lesion, 25 %stenosed.  Ost LAD to Prox LAD lesion, 10 %stenosed.  Ost 1st Mrg to 1st Mrg lesion, 25 %stenosed.  The left ventricular systolic function is normal.  LV end diastolic pressure is normal.  The left ventricular ejection fraction is 55-65% by visual estimate.  There is no aortic valve stenosis.  There is no aortic valve regurgitation.  LV end diastolic pressure is normal.  Normal right heart pressures. PA sat 70%. CO 6.1 L/min. CI 2.5.  No evidence of aortic dissection.   Mild nonobstructive CAD.  Normal right heart pressures.  Continue preventive therapy.     Echo: 03/01/17  Study Conclusions  - Left ventricle: The cavity size was normal. Systolic function was   normal. The estimated ejection fraction was in the range of 60%   to 65%. Wall motion was normal; there were no regional wall   motion abnormalities. Left ventricular diastolic function   parameters were normal. - Atrial septum: No defect or patent foramen ovale was identified. _____________   History of Present Illness     Steve Bryan is a 56 y.o.male history of HTN, DM2 presents with chest pain. He is regularly followed at the [place] in [place], recently transferred to the [facility]. He reported a recent diagnosis of afib, but has not started anticoag yet. He just recently completed an event monitor and his physician was  awaiting the results. He also reported he recent diagnosis of pulmonary HTN diagnosed by echo, he stated he was also told that his pulmonary artery is very enlarged. He had not undergone any further workup.   He reported a 6-8 month history of SOB. Can come on at rest or with activity. Often associated with palpitations. As part of his workup for SOB at the [place] he complete a nuclear stress test 3/5 at the [place]. On his way home he developed 8/10 pressure left chest radiating down into left arm with nausea and SOB. Symptoms improves significantly with sublingual NG. This was his first episode of chest pain. He was seen by Dr. Harl Bowie at [facility] ED due to mildly elevated troponin of 0.07. Was given ASA and started on heparin with concern for NSTEMI. He was transferred to [facility] with plans for [facility].   Hospital Course     Consultants: None  He underwent R/LHC on 02/28/17 showing mild nonobstructive CAD with normal right heart pressures. Labs this admission showed stable electrolytes and Cr. Trig 469, unable to calculate LDL, therefore fenofibrate was added. Blood pressures were not controlled. Was on ACEi/ARB in the past but reported cough. Cough did persist after stopping both. Given his hx of DM, added back losartan 25mg  daily. Ddimer was negative. Follow up echo showed normal EF with no WMA.   His labs were stable post procedure, and stable Hgb. He was seen by Dr. Acie Fredrickson on 03/01/53 and determined stable for discharge. He has follow up  with Dr. Sherral Hammers at the [facility] tomorrow and can follow up with cardiology as needed.  _____________  Discharge Vitals Blood pressure (!) 148/90, pulse 84, temperature 99.1 F (37.3 C), temperature source Oral, resp. rate 19, height 6\' 1"  (1.854 m), weight 249 lb 8 oz (113.2 kg), SpO2 95 %.  Filed Weights   02/27/17 1930 02/28/17 0400 03/01/17 0506  Weight: 250 lb (113.4 kg) 250 lb (113.4 kg) 249 lb 8 oz (113.2 kg)    Labs & Radiologic Studies     CBC  Recent Labs   02/28/17 0238 03/01/17 0535  WBC 7.4 6.1  HGB 14.7 14.1  HCT 42.9 42.2  MCV 88.5 88.5  PLT 177 607   Basic Metabolic Panel  Recent Labs  02/27/17 1243 02/28/17 0238  NA 137 139  K 4.2 3.8  CL 107 108  CO2 21* 24  GLUCOSE 226* 190*  BUN 17 14  CREATININE 0.99 1.10  CALCIUM 9.4 9.2   Liver Function Tests  Recent Labs  02/27/17 1243  AST 31  ALT 50  ALKPHOS 67  BILITOT 0.4  PROT 6.9  ALBUMIN 4.2   No results for input(s): LIPASE, AMYLASE in the last 72 hours. Cardiac Enzymes  Recent Labs  02/27/17 2102 02/28/17 0238 02/28/17 0906  TROPONINI 0.04* <0.03 <0.03   BNP Invalid input(s): POCBNP D-Dimer  Recent Labs  03/01/17 0924  DDIMER 0.40   Hemoglobin A1C  Recent Labs  02/27/17 2102  HGBA1C 7.4*   Fasting Lipid Panel  Recent Labs  02/28/17 0238  CHOL 169  HDL 29*  LDLCALC UNABLE TO CALCULATE IF TRIGLYCERIDE OVER 400 mg/dL  TRIG 469*  CHOLHDL 5.8   Thyroid Function Tests No results for input(s): TSH, T4TOTAL, T3FREE, THYROIDAB in the last 72 hours.  Invalid input(s): FREET3 _____________  Dg Chest 2 View  Result Date: 02/27/2017 CLINICAL DATA:  Chest pain during and after a cardiac stress test today. History of pulmonary hypertension, atrial fibrillation, diabetes, nonsmoker. EXAM: CHEST  2 VIEW COMPARISON:  PA and lateral chest x-ray of November 07, 2007 FINDINGS: The lungs are mildly hypoinflated but clear. There is no pleural effusion, pneumothorax, or pneumomediastinum. The cardiac silhouette is mildly enlarged. The pulmonary vascularity is normal. The trachea is midline. The bony thorax exhibits no acute abnormality. IMPRESSION: Mild hypoinflation.  No acute cardiopulmonary abnormality. Electronically Signed   By: David  Martinique M.D.   On: 02/27/2017 14:09   Disposition   Pt is being discharged home today in good condition.  Follow-up Plans & Appointments    Follow-up Information    [facility], [place] Follow up.   Specialty:   Internal Medicine Why:  Keep scheduled follow up appt Contact information: [address] [place] [address]        Carlyle Dolly, MD Follow up.   Specialty:  Cardiology Why:  Follow up as needed Contact information: [address] [place] [address] [phone]          Discharge Instructions    Call MD for:  redness, tenderness, or signs of infection (pain, swelling, redness, odor or green/yellow discharge around incision site)    Complete by:  As directed    Diet - low sodium heart healthy    Complete by:  As directed    Discharge instructions    Complete by:  As directed    Radial Site Care Refer to this sheet in the next few weeks. These instructions provide you with information on caring for yourself  after your procedure. Your caregiver may also give you more specific instructions. Your treatment has been planned according to current medical practices, but problems sometimes occur. Call your caregiver if you have any problems or questions after your procedure. HOME CARE INSTRUCTIONS You may shower the day after the procedure.Remove the bandage (dressing) and gently wash the site with plain soap and water.Gently pat the site dry.  Do not apply powder or lotion to the site.  Do not submerge the affected site in water for 3 to 5 days.  Inspect the site at least twice daily.  Do not flex or bend the affected arm for 24 hours.  No lifting over 5 pounds (2.3 kg) for 5 days after your procedure.  Do not drive home if you are discharged the same day of the procedure. Have someone else drive you.  You may drive 24 hours after the procedure unless otherwise instructed by your caregiver.  What to expect: Any bruising will usually fade within 1 to 2 weeks.  Blood that collects in the tissue (hematoma) may be painful to the touch. It should usually decrease in size and tenderness within 1 to 2 weeks.  SEEK IMMEDIATE MEDICAL CARE IF: You have unusual pain at the  radial site.  You have redness, warmth, swelling, or pain at the radial site.  You have drainage (other than a small amount of blood on the dressing).  You have chills.  You have a fever or persistent symptoms for more than 72 hours.  You have a fever and your symptoms suddenly get worse.  Your arm becomes pale, cool, tingly, or numb.  You have heavy bleeding from the site. Hold pressure on the site.    We have added fenofibrate to your medications because your triglycerides were elevated. Please have your PCP follow up on your labs work to determine the need for addition of statin therapy. Also added losartan for your blood pressure, and protection for your kidneys given your hx of diabetes. You will need to have your kidney function checked in 1-2 weeks by your PCP. Please call with any questions.   Increase activity slowly    Complete by:  As directed       Discharge Medications   Current Discharge Medication List    START taking these medications   Details  aspirin EC 81 MG EC tablet Take 1 tablet (81 mg total) by mouth daily.    fenofibrate 160 MG tablet Take 1 tablet (160 mg total) by mouth daily. Qty: 30 tablet, Refills: 6    losartan (COZAAR) 25 MG tablet Take 1 tablet (25 mg total) by mouth daily. Qty: 30 tablet, Refills: 6      CONTINUE these medications which have NOT CHANGED   Details  allopurinol (ZYLOPRIM) 300 MG tablet Take 300 mg by mouth daily.    amLODipine (NORVASC) 10 MG tablet Take 10 mg by mouth daily.    DULoxetine (CYMBALTA) 60 MG capsule Take 60 mg by mouth daily.    ergocalciferol (VITAMIN D2) 50000 UNITS capsule Take 50,000 units by mouth twice weekly on Wednesday and Saturday    glipiZIDE (GLUCOTROL) 10 MG tablet Take 10 mg by mouth 2 (two) times daily before a meal.    insulin glargine (LANTUS) 100 UNIT/ML injection Inject 30 Units into the skin at bedtime.     meloxicam (MOBIC) 15 MG tablet Take 7.5 mg by mouth daily.    metFORMIN  (GLUCOPHAGE) 500 MG tablet Take 1,000 mg by mouth  2 (two) times daily with a meal.    methocarbamol (ROBAXIN) 500 MG tablet Take 500 mg by mouth every 8 (eight) hours as needed for muscle spasms.    Multiple Vitamin (MULTIVITAMIN WITH MINERALS) TABS tablet Take 1 tablet by mouth daily.    omeprazole (PRILOSEC) 20 MG capsule Take 40 mg by mouth daily.     tamsulosin (FLOMAX) 0.4 MG CAPS capsule Take 0.4 mg by mouth.    traMADol (ULTRAM) 50 MG tablet Take 50 mg by mouth every 6 (six) hours as needed.          Outstanding Labs/Studies   Consider BMET in 1 week, and FLP/LFTs in 6 weeks.   Duration of Discharge Encounter   Greater than 30 minutes including physician time.  Signed, Reino Bellis NP-C 03/01/2017, 12:12 PM  Attending Note:   The patient was seen and examined.  Agree with assessment and plan as noted above.  Changes made to the above note as needed.  Patient seen and independently examined with Reino Bellis, NP .   We discussed all aspects of the encounter. I agree with the assessment and plan as stated above.  See progress note from earlier today  Normal right and left heart cath  Echo is unremarkable   DC to home He may follow up with Dr. Harl Bowie if needed   I have spent a total of 40 minutes with patient reviewing hospital  notes , telemetry, EKGs, labs and examining patient as well as establishing an assessment and plan that was discussed with the patient. > 50% of time was spent in direct patient care.    Thayer Headings, Brooke Bonito., MD, [facility] 03/01/2017, 2:41 PM [address]  [place] Pager [phone]

## 2017-06-15 ENCOUNTER — Encounter (HOSPITAL_COMMUNITY): Payer: Self-pay | Admitting: Cardiology

## 2017-06-15 ENCOUNTER — Emergency Department (HOSPITAL_COMMUNITY)
Admission: EM | Admit: 2017-06-15 | Discharge: 2017-06-15 | Disposition: A | Discharge status: Home / Self Care | Payer: Non-veteran care | Attending: Emergency Medicine | Admitting: Emergency Medicine

## 2017-06-15 ENCOUNTER — Emergency Department (HOSPITAL_COMMUNITY): Payer: Non-veteran care

## 2017-06-15 DIAGNOSIS — Z79899 Other long term (current) drug therapy: Secondary | ICD-10-CM | POA: Insufficient documentation

## 2017-06-15 DIAGNOSIS — S60122A Contusion of left index finger with damage to nail, initial encounter: Secondary | ICD-10-CM | POA: Diagnosis not present

## 2017-06-15 DIAGNOSIS — E1140 Type 2 diabetes mellitus with diabetic neuropathy, unspecified: Secondary | ICD-10-CM | POA: Diagnosis not present

## 2017-06-15 DIAGNOSIS — I10 Essential (primary) hypertension: Secondary | ICD-10-CM | POA: Diagnosis not present

## 2017-06-15 DIAGNOSIS — S61211A Laceration without foreign body of left index finger without damage to nail, initial encounter: Secondary | ICD-10-CM

## 2017-06-15 DIAGNOSIS — Z7982 Long term (current) use of aspirin: Secondary | ICD-10-CM | POA: Insufficient documentation

## 2017-06-15 DIAGNOSIS — Z7984 Long term (current) use of oral hypoglycemic drugs: Secondary | ICD-10-CM | POA: Insufficient documentation

## 2017-06-15 DIAGNOSIS — Z794 Long term (current) use of insulin: Secondary | ICD-10-CM | POA: Insufficient documentation

## 2017-06-15 DIAGNOSIS — Y999 Unspecified external cause status: Secondary | ICD-10-CM | POA: Insufficient documentation

## 2017-06-15 DIAGNOSIS — S60941A Unspecified superficial injury of left index finger, initial encounter: Secondary | ICD-10-CM | POA: Diagnosis present

## 2017-06-15 DIAGNOSIS — W270XXA Contact with workbench tool, initial encounter: Secondary | ICD-10-CM | POA: Insufficient documentation

## 2017-06-15 DIAGNOSIS — Z23 Encounter for immunization: Secondary | ICD-10-CM | POA: Insufficient documentation

## 2017-06-15 DIAGNOSIS — Y939 Activity, unspecified: Secondary | ICD-10-CM | POA: Diagnosis not present

## 2017-06-15 DIAGNOSIS — Y929 Unspecified place or not applicable: Secondary | ICD-10-CM | POA: Insufficient documentation

## 2017-06-15 MED ORDER — DOXYCYCLINE HYCLATE 100 MG PO TABS
100.0000 mg | ORAL_TABLET | Freq: Once | ORAL | Status: DC
  Filled 2017-06-15: qty 1

## 2017-06-15 MED ORDER — TETANUS-DIPHTH-ACELL PERTUSSIS 5-2.5-18.5 LF-MCG/0.5 IM SUSP
0.5000 mL | Freq: Once | INTRAMUSCULAR | Status: AC
  Administered 2017-06-15: 0.5 mL via INTRAMUSCULAR
  Filled 2017-06-15: qty 0.5

## 2017-06-15 MED ORDER — DOXYCYCLINE HYCLATE 100 MG PO TABS
100.0000 mg | ORAL_TABLET | Freq: Once | ORAL | Status: AC
  Administered 2017-06-15: 100 mg via ORAL

## 2017-06-15 MED ORDER — DOXYCYCLINE HYCLATE 100 MG PO CAPS: 100.0000 mg | ORAL_CAPSULE | Freq: Two times a day (BID) | ORAL | 0 refills | Status: DC

## 2017-06-15 NOTE — Discharge Instructions (Signed)
Please cleanse your finger daily with soap and water. Apply fresh dressing daily until healed. Please use doxycycline 2 times daily with food. Please see your primary physician or return to the emergency department if any signs of infection.

## 2017-06-15 NOTE — ED Triage Notes (Signed)
Patient states he hit his left index finger with a hammer yesterday.

## 2017-06-15 NOTE — ED Provider Notes (Signed)
[facility] DEPT Provider Note   CSN: [id number] Arrival date & time: 06/15/17  1259     History   Chief Complaint Chief Complaint  Patient presents with  . Finger Injury    HPI Steve Bryan is a 56 y.o. male.  Patient is a 8-year-old male who presents to the emergency department with complaint of injury to the index finger on the left.  The patient states that he he injured his finger twice. The last injury being on Monday, June 18. He hit it 2 times with a hammer. During this last episode he states that the skin ruptured and it bled a lot. From the previous injury to the finger he noticed some bruising under the nail. He states he can feel some pressure, but it does not hurt. It is of note that the patient has diabetic neuropathies, and he from time to time has difficulty feeling things in his fingers. The patient presents at this time for evaluation concerning the left index finger.      Past Medical History:  Diagnosis Date  . Chronic pain syndrome   . Diabetes mellitus without complication ([place])   . Diabetic neuropathy ([place])   . Fibromyalgia   . GERD (gastroesophageal reflux disease)   . Hypertension   . Hypertriglyceridemia   . Left retinopathy of prematurity   . Sleep apnea   . Varicose veins of both lower extremities     Patient Active Problem List   Diagnosis Date Noted  . Hypertriglyceridemia 03/01/2017  . Chest pain   . Elevated troponin   . NSTEMI (non-ST elevated myocardial infarction) ([place]) 02/27/2017  . DIABETES MELLITUS, TYPE II 07/22/2008  . OBESITY 07/22/2008  . Essential hypertension 07/22/2008  . VARICOSE VEIN 07/22/2008    Past Surgical History:  Procedure Laterality Date  . COLONOSCOPY N/A 06/19/2014   Procedure: COLONOSCOPY;  Surgeon: Rogene Houston, MD;  [facility];  Service: Endoscopy;  Laterality: N/A;  930  . Right Inguinal Hernia Repair    . RIGHT/LEFT HEART CATH AND CORONARY ANGIOGRAPHY N/A 02/28/2017   Procedure:  Right/Left Heart Cath and Coronary Angiography;  Surgeon: Jettie Booze, MD;  Location: [facility];  Service: Cardiovascular;  Laterality: N/A;       Home Medications    Prior to Admission medications   Medication Sig Start Date End Date Taking? Authorizing Provider  allopurinol (ZYLOPRIM) 300 MG tablet Take 300 mg by mouth daily.    [provider]  amLODipine (NORVASC) 10 MG tablet Take 10 mg by mouth daily.    [provider]  aspirin EC 81 MG EC tablet Take 1 tablet (81 mg total) by mouth daily. 03/02/17   Cheryln Manly, NP  DULoxetine (CYMBALTA) 60 MG capsule Take 60 mg by mouth daily.    [provider]  ergocalciferol (VITAMIN D2) 50000 UNITS capsule Take 50,000 units by mouth twice weekly on Wednesday and Saturday    [provider]  fenofibrate 160 MG tablet Take 1 tablet (160 mg total) by mouth daily. 03/02/17   Cheryln Manly, NP  glipiZIDE (GLUCOTROL) 10 MG tablet Take 10 mg by mouth 2 (two) times daily before a meal.    [provider]  insulin glargine (LANTUS) 100 UNIT/ML injection Inject 30 Units into the skin at bedtime.     [provider]  losartan (COZAAR) 25 MG tablet Take 1 tablet (25 mg total) by mouth daily. 03/01/17   Cheryln Manly, NP  meloxicam (MOBIC) 15 MG tablet Take 7.5 mg by mouth daily.    [provider]  metFORMIN (GLUCOPHAGE) 500 MG tablet Take 1,000 mg by mouth 2 (two) times daily with a meal.    [provider]  methocarbamol (ROBAXIN) 500 MG tablet Take 500 mg by mouth every 8 (eight) hours as needed for muscle spasms.    [provider]  Multiple Vitamin (MULTIVITAMIN WITH MINERALS) TABS tablet Take 1 tablet by mouth daily.    [provider]  omeprazole (PRILOSEC) 20 MG capsule Take 40 mg by mouth daily.     [provider]  tamsulosin (FLOMAX) 0.4 MG CAPS capsule Take 0.4 mg by mouth.    [provider]  traMADol (ULTRAM)  50 MG tablet Take 50 mg by mouth every 6 (six) hours as needed.    [provider]    Family History Family History  Problem Relation Age of Onset  . Colon cancer Neg Hx     Social History Social History  Substance Use Topics  . Smoking status: Never Smoker  . Smokeless tobacco: Not on file  . Alcohol use No     Allergies   Effexor [venlafaxine] and Lisinopril   Review of Systems Review of Systems  Constitutional: Negative for activity change.       All ROS Neg except as noted in HPI  HENT: Negative for nosebleeds.   Eyes: Negative for photophobia and discharge.  Respiratory: Negative for cough, shortness of breath and wheezing.   Cardiovascular: Negative for chest pain and palpitations.  Gastrointestinal: Negative for abdominal pain and blood in stool.  Genitourinary: Negative for dysuria, frequency and hematuria.  Musculoskeletal: Negative for arthralgias, back pain and neck pain.       Finger injury  Skin: Negative.   Neurological: Negative for dizziness, seizures and speech difficulty.  Psychiatric/Behavioral: Negative for confusion and hallucinations.     Physical Exam Updated Vital Signs BP (!) 159/99 (BP Location: Right Arm) Comment: Repeat  Pulse 79   Temp 98.2 F (36.8 C) (Oral)   Resp 20   Ht 6\' 1"  (1.854 m)   Wt 113.4 kg (250 lb)   SpO2 98%   BMI 32.98 kg/m   Physical Exam  Constitutional: He is oriented to person, place, and time. He appears well-developed and well-nourished.  Non-toxic appearance.  HENT:  Head: Normocephalic.  Right Ear: Tympanic membrane and external ear normal.  Left Ear: Tympanic membrane and external ear normal.  Eyes: EOM and lids are normal. Pupils are equal, round, and reactive to light.  Neck: Normal range of motion. Neck supple. Carotid bruit is not present.  Cardiovascular: Normal rate, regular rhythm, normal heart sounds, intact distal pulses and normal pulses.   Pulmonary/Chest: Breath sounds normal. No  respiratory distress.  Abdominal: Soft. Bowel sounds are normal. There is no tenderness. There is no guarding.  Musculoskeletal: Normal range of motion.  There is full range of motion of the right and left shoulder, elbow, and wrists. There is a laceration to the medial aspect of the distal left index finger. There is bruising under the nail of the left index finger. There is mild swelling of the distal finger. This full range of motion of the index finger and all the other fingers of the left hand. The capillary refill is less than 2 seconds.  Lymphadenopathy:       Head (right side): No submandibular adenopathy present.       Head (left side): No submandibular  adenopathy present.    He has no cervical adenopathy.  Neurological: He is alert and oriented to person, place, and time. He has normal strength. No cranial nerve deficit or sensory deficit.  Skin: Skin is warm and dry.  Psychiatric: He has a normal mood and affect. His speech is normal.  Nursing note and vitals reviewed.    ED Treatments / Results  Labs (all labs ordered are listed, but only abnormal results are displayed) Labs Reviewed - No data to display  EKG  EKG Interpretation None       Radiology Dg Finger Index Left  Result Date: 06/15/2017 CLINICAL DATA:  Hit the left index finger with a hammer on Monday with pain and swelling of the finger tip. EXAM: LEFT INDEX FINGER 2+V COMPARISON:  None. FINDINGS: There is no evidence of fracture or dislocation. There is no evidence of arthropathy or other focal bone abnormality. Soft tissues are unremarkable. IMPRESSION: Negative. Electronically Signed   By: Abelardo Diesel M.D.   On: 06/15/2017 13:37    Procedures Procedures (including critical care time)  Medications Ordered in ED Medications  Tdap (BOOSTRIX) injection 0.5 mL (not administered)  doxycycline (VIBRA-TABS) tablet 100 mg (not administered)  doxycycline (VIBRA-TABS) tablet 100 mg (not administered)      Initial Impression / Assessment and Plan / ED Course  I have reviewed the triage vital signs and the nursing notes.  Pertinent labs & imaging results that were available during my care of the patient were reviewed by me and considered in my medical decision making (see chart for details).      Final Clinical Impressions(s) / ED Diagnoses MDM Patient is a diabetic with history of diabetic neuropathies. He has a laceration of the index finger and trauma by hammer onto the nail into the finger 2. The x-ray of the index finger is negative for fracture or dislocation. The patient will be covered with doxycycline 2 times daily. Due to the length of time that the laceration has been open to the finger, we will not do a suture repair at this time. I've asked patient to cleanse the area with soap and water daily and keep a dressing on it until the area heals. The patient is in agreement with this plan. Tetanus status was updated.    Final diagnoses:  Laceration of left index finger without foreign body without damage to nail, initial encounter  Contusion of left index finger with damage to nail, initial encounter    New Prescriptions New Prescriptions   No medications on file     Lily Kocher, Hershal Coria 06/16/17 2248    Nat Christen, MD 06/17/17 [phone]

## 2018-09-13 IMAGING — DX DG FINGER INDEX 2+V*L*
3 series · 3 of 3 positions shown · non-contrast
Comparison: None.

CLINICAL DATA: Hit the left index finger with a hammer on [REDACTED]
with pain and swelling of the finger tip.

EXAM:
LEFT INDEX FINGER 2+V

[finger ap]
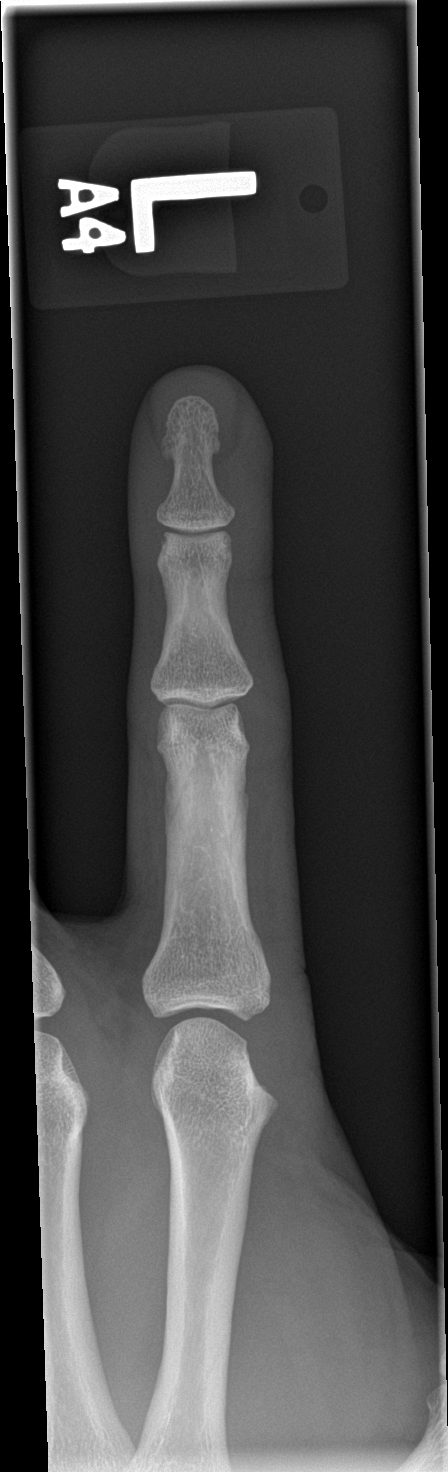

[finger obl]
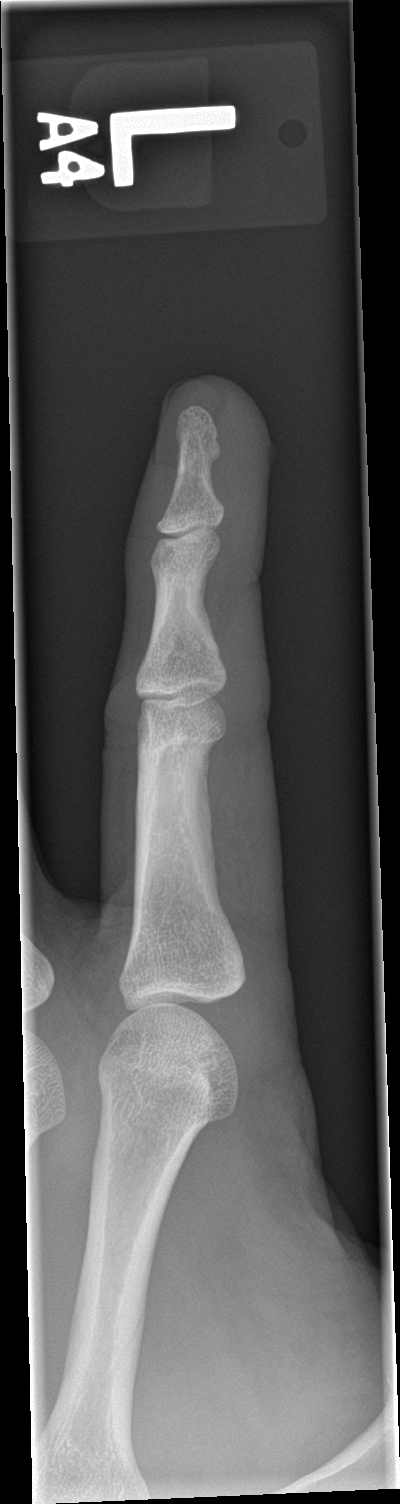

[finger lat]
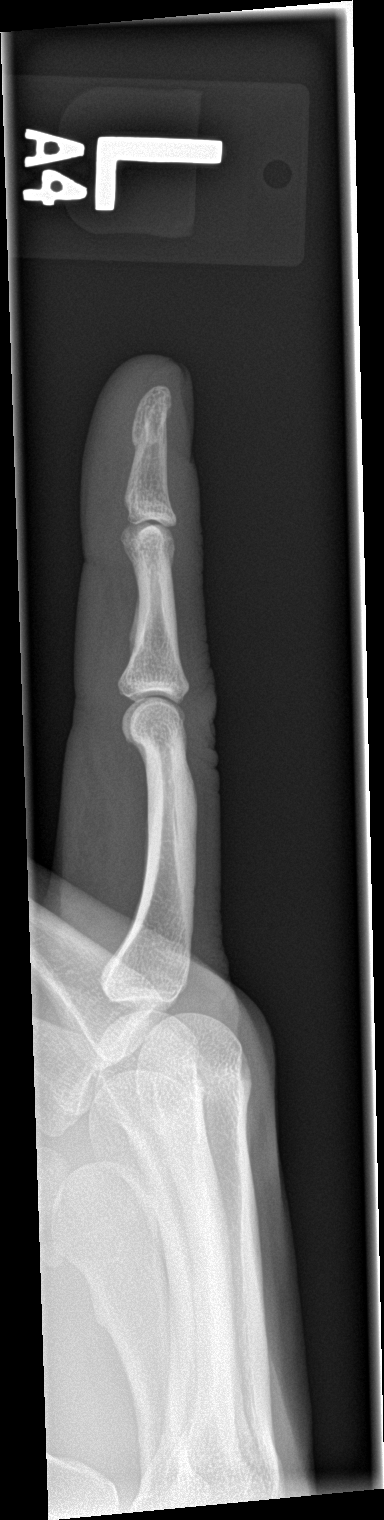

[3 of 3 positions shown; findings below may reference images not displayed]

FINDINGS: There is no evidence of fracture or dislocation. There is no
evidence of arthropathy or other focal bone abnormality. Soft
tissues are unremarkable.
IMPRESSION: Negative.

## 2021-04-28 ENCOUNTER — Encounter (INDEPENDENT_AMBULATORY_CARE_PROVIDER_SITE_OTHER): Payer: Self-pay | Admitting: *Deleted

## 2021-08-23 ENCOUNTER — Other Ambulatory Visit (INDEPENDENT_AMBULATORY_CARE_PROVIDER_SITE_OTHER): Payer: Self-pay

## 2021-08-23 ENCOUNTER — Ambulatory Visit (HOSPITAL_COMMUNITY)
Admission: RE | Admit: 2021-08-23 | Discharge: 2021-08-23 | Disposition: A | Discharge status: Home / Self Care | Payer: No Typology Code available for payment source | Source: Ambulatory Visit | Attending: Gastroenterology | Admitting: Gastroenterology

## 2021-08-23 ENCOUNTER — Other Ambulatory Visit: Payer: Self-pay

## 2021-08-23 ENCOUNTER — Encounter (INDEPENDENT_AMBULATORY_CARE_PROVIDER_SITE_OTHER): Payer: Self-pay | Admitting: Gastroenterology

## 2021-08-23 ENCOUNTER — Ambulatory Visit (INDEPENDENT_AMBULATORY_CARE_PROVIDER_SITE_OTHER): Payer: No Typology Code available for payment source | Admitting: Gastroenterology

## 2021-08-23 ENCOUNTER — Ambulatory Visit (INDEPENDENT_AMBULATORY_CARE_PROVIDER_SITE_OTHER): Payer: Non-veteran care | Admitting: Gastroenterology

## 2021-08-23 DIAGNOSIS — K589 Irritable bowel syndrome without diarrhea: Secondary | ICD-10-CM

## 2021-08-23 DIAGNOSIS — K219 Gastro-esophageal reflux disease without esophagitis: Secondary | ICD-10-CM | POA: Diagnosis not present

## 2021-08-23 DIAGNOSIS — R1030 Lower abdominal pain, unspecified: Secondary | ICD-10-CM

## 2021-08-23 DIAGNOSIS — R109 Unspecified abdominal pain: Secondary | ICD-10-CM | POA: Insufficient documentation

## 2021-08-23 NOTE — Progress Notes (Signed)
Maylon Peppers, M.D. Gastroenterology & Hepatology [facility] [address] [place], [place] [address] Primary Care Physician: [place] [address] [place] [id number]  Referring MD: PCP  Chief Complaint: Abdominal pain  History of Present Illness: Steve Bryan is a 60 y.o. male with past medical history of diabetes, diabetic neuropathy, fibromyalgia, GERD, hypertension, hyperlipidemia, chronic pain syndrome, PTSD, depression, who presents for evaluation of abdominal pain.  Patient reports that in December 2021 he underwent his prostatectomy, he has presented intermittent lower abdominal pain, described as cramping. The patient reports that he has presented episodes of abdominal pain in his lower abdomen before he is about to have a bowel movement, and actually worsened when he is moving his bowels. Sometimes he has to wait up to 30 minutes for the pain to subside. Does not take any pain medication for this pain. He states that around the same time, he has presented some episodes of tenesmus after going to the restroom. States that he usually has 2 loose bowel movements, from which one of the bowel movements is very large. State she has to wait in the restroom sometimes up to 30 minutes as he has the sensation that he has not emptied his bowels and keeps having bowel movements. He does not take any laxatives, including Miralax as he is moving his bowels daily.  Patient reports that he has noticed he has a low threshold for pain.  Evaluation of the symptoms he has presented in the past, he had a CT of the abdomen and pelvis with IV contrast on 04/06/2021 which showed normal findings per report.  Patient brings the CD with the images today.  He also had previous testing on March 2022 which showed an ALT of 54, AST of 26, total bilirubin 0.4, normal electrolytes, platelets 212, INR 0.89, hemoglobin 14.9, white blood cell count  8.64.  He states that he has a history of GERD. Takes omeprazole 2 40 mg tablets every night and it controls his heartburn. Has never tried taking one pill. Denies any dysphagia or odynophagia.  The patient denies having any nausea, vomiting, fever, chills, hematochezia, melena, hematemesis, abdominal distention, diarrhea, jaundice, pruritus.  Has lost weight due to Ozempic, possibly 8-10 lb. He endorses having a bing eating problem.  Last [id number] Last Colonoscopy:2015 -  Examination performed to cecum. 4 mm polyp ablated via cold biopsy from proximal transverse colon.Had a tubular adenoma. Small external hemorrhoids.  FHx: neg for any gastrointestinal/liver disease, no malignancies Social: neg smoking, alcohol or illicit drug use Surgical: prostatectomy  Past Medical History: Past Medical History:  Diagnosis Date   Chronic pain syndrome    Diabetes mellitus without complication ([facility])    Diabetic neuropathy ([facility])    Fibromyalgia    GERD (gastroesophageal reflux disease)    Hypertension    Hypertriglyceridemia    Left retinopathy of prematurity    Sleep apnea    Varicose veins of both lower extremities     Past Surgical History: Past Surgical History:  Procedure Laterality Date   COLONOSCOPY N/A 06/19/2014   Procedure: COLONOSCOPY;  Surgeon: Rogene Houston, MD;  [facility];  Service: Endoscopy;  Laterality: N/A;  930   Right Inguinal Hernia Repair     RIGHT/LEFT HEART CATH AND CORONARY ANGIOGRAPHY N/A 02/28/2017   Procedure: Right/Left Heart Cath and Coronary Angiography;  Surgeon: Jettie Booze, MD;  Location: [facility];  Service: Cardiovascular;  Laterality: N/A;  Family History: Family History  Problem Relation Age of Onset   Colon cancer Neg Hx     Social History: Social History   Tobacco Use  Smoking Status Never  Smokeless Tobacco Never   Social History   Substance and Sexual Activity  Alcohol Use No   Social History    Substance and Sexual Activity  Drug Use No    Allergies: Allergies  Allergen Reactions   Effexor [Venlafaxine] Other (See Comments)    Night sweats   Lisinopril Cough    Medications: Current Outpatient Medications  Medication Sig Dispense Refill   allopurinol (ZYLOPRIM) 300 MG tablet Take 300 mg by mouth daily.     aspirin EC 81 MG EC tablet Take 1 tablet (81 mg total) by mouth daily.     carvedilol (COREG) 25 MG tablet Take 25 mg by mouth. One half bid     DULoxetine (CYMBALTA) 60 MG capsule Take 60 mg by mouth daily.     ergocalciferol (VITAMIN D2) 50000 UNITS capsule Take 50,000 units by mouth twice weekly on Wednesday and Saturday     insulin glargine (LANTUS) 100 UNIT/ML injection Inject 30 Units into the skin at bedtime.      losartan (COZAAR) 25 MG tablet Take 1 tablet (25 mg total) by mouth daily. 30 tablet 6   Melatonin-Pyridoxine (MELATIN PO) Take by mouth.     metFORMIN (GLUCOPHAGE) 500 MG tablet Take 1,000 mg by mouth 2 (two) times daily with a meal.     methocarbamol (ROBAXIN) 500 MG tablet Take 500 mg by mouth every 8 (eight) hours as needed for muscle spasms.     Multiple Vitamin (MULTIVITAMIN WITH MINERALS) TABS tablet Take 1 tablet by mouth daily.     omeprazole (PRILOSEC) 20 MG capsule Take 40 mg by mouth daily.      QUEtiapine Fumarate (SEROQUEL PO) Take by mouth. One qhs     Semaglutide (OZEMPIC, 2 MG/DOSE, [place]) Inject into the skin.     No current facility-administered medications for this visit.    Review of Systems: GENERAL: negative for malaise, night sweats HEENT: No changes in hearing or vision, no nose bleeds or other nasal problems. NECK: Negative for lumps, goiter, pain and significant neck swelling RESPIRATORY: Negative for cough, wheezing CARDIOVASCULAR: Negative for chest pain, leg swelling, palpitations, orthopnea GI: SEE HPI MUSCULOSKELETAL: Negative for joint pain or swelling, back pain, and muscle pain. SKIN: Negative for lesions,  rash PSYCH: Negative for sleep disturbance, mood disorder and recent psychosocial stressors. HEMATOLOGY Negative for prolonged bleeding, bruising easily, and swollen nodes. ENDOCRINE: Negative for cold or heat intolerance, polyuria, polydipsia and goiter. NEURO: negative for tremor, gait imbalance, syncope and seizures. The remainder of the review of systems is noncontributory.   Physical Exam: BP 132/86 (BP Location: Left Arm, Patient Position: Sitting, Cuff Size: Large)   Pulse 79   Temp 97.8 F (36.6 C) (Oral)   Ht '6\' 1"'$  (1.854 m)   Wt 241 lb (109.3 kg)   BMI 31.80 kg/m  GENERAL: The patient is AO x3, in no acute distress. HEENT: Head is normocephalic and atraumatic. EOMI are intact. Mouth is well hydrated and without lesions. NECK: Supple. No masses LUNGS: Clear to auscultation. No presence of rhonchi/wheezing/rales. Adequate chest expansion HEART: RRR, normal s1 and s2. ABDOMEN: mildly tender diffusely , no guarding, no peritoneal signs, and nondistended. BS +. Has umbilical hernia. EXTREMITIES: Without any cyanosis, clubbing, rash, lesions or edema. NEUROLOGIC: AOx3, no focal motor deficit. SKIN: no jaundice, no  rashes   Imaging/Labs: as above  I personally reviewed and interpreted the available labs, imaging and endoscopic files.  Impression and Plan: BLU HEWATT is a 60 y.o. male with past medical history of diabetes, diabetic neuropathy, fibromyalgia, GERD, hypertension, hyperlipidemia, chronic pain syndrome, PTSD, depression, who presents for evaluation of abdominal pain.  The patient has presented persistent symptoms of abdominal pain of unclear etiology.  Notably, he has undergone recent abdominal imaging that was unremarkable (I will review these images with the radiologist on-call as the patient brought the CD today with him), which is reassuring.  He has not presented any red flag signs.  Given the classical description of his symptoms of pain preceding his bowel  movements, I think it is likely he has irritable bowel syndrome pain to his current presentation.  I gave him a dietary list that he will need to follow to improve his symptomatology.  However, we will evaluate this pain further with a colonoscopy.  It is possible that he is presenting some episodes of significant constipation that leads to persistent tenesmus, which will be further evaluated with an abdominal x-ray.  Finally, his GERD has been well controlled while on a PPI.  We will continue his medication but given the longstanding symptoms he has presented in the past, we will screen for Barrett's esophagus with an EGD.  -Schedule EGD and colonoscopy - Perform abdominal xray - Explained presumed etiology of IBS symptoms. Patient was counseled about the benefit of implementing a low FODMAP to improve symptoms and recurrent episodes. A dietary list was provided to the patient. Also, the patient was counseled about the benefit of avoiding stressing situations and potential environmental triggers leading to symptomatology.  All questions were answered.      Maylon Peppers, MD Gastroenterology and Hepatology [facility]

## 2021-08-23 NOTE — Patient Instructions (Addendum)
Schedule EGD and colonoscopy Perform abdominal xray Explained presumed etiology of IBS symptoms. Patient was counseled about the benefit of implementing a low FODMAP to improve symptoms and recurrent episodes. A dietary list was provided to the patient. Also, the patient was counseled about the benefit of avoiding stressing situations and potential environmental triggers leading to symptomatology.

## 2021-09-08 ENCOUNTER — Other Ambulatory Visit (INDEPENDENT_AMBULATORY_CARE_PROVIDER_SITE_OTHER): Payer: Self-pay

## 2021-09-10 ENCOUNTER — Telehealth (INDEPENDENT_AMBULATORY_CARE_PROVIDER_SITE_OTHER): Payer: Self-pay

## 2021-09-10 ENCOUNTER — Encounter (INDEPENDENT_AMBULATORY_CARE_PROVIDER_SITE_OTHER): Payer: Self-pay

## 2021-09-10 ENCOUNTER — Other Ambulatory Visit (INDEPENDENT_AMBULATORY_CARE_PROVIDER_SITE_OTHER): Payer: Self-pay

## 2021-09-10 MED ORDER — PEG 3350-KCL-NA BICARB-NACL 420 G PO SOLR: 4000.0000 mL | ORAL | 0 refills | Status: DC

## 2021-09-10 NOTE — Telephone Encounter (Signed)
Steve Bryan, CMA  

## 2021-09-22 ENCOUNTER — Encounter (INDEPENDENT_AMBULATORY_CARE_PROVIDER_SITE_OTHER): Payer: Self-pay | Admitting: Gastroenterology

## 2021-09-23 ENCOUNTER — Other Ambulatory Visit: Payer: Self-pay

## 2021-09-23 ENCOUNTER — Ambulatory Visit (INDEPENDENT_AMBULATORY_CARE_PROVIDER_SITE_OTHER): Payer: No Typology Code available for payment source | Admitting: Urology

## 2021-09-23 ENCOUNTER — Encounter: Payer: Self-pay | Admitting: Urology

## 2021-09-23 VITALS — BP 124/76 | HR 87 | Temp 98.5°F | Ht 73.0 in | Wt 245.6 lb

## 2021-09-23 DIAGNOSIS — N5231 Erectile dysfunction following radical prostatectomy: Secondary | ICD-10-CM

## 2021-09-23 DIAGNOSIS — C61 Malignant neoplasm of prostate: Secondary | ICD-10-CM

## 2021-09-23 DIAGNOSIS — N3941 Urge incontinence: Secondary | ICD-10-CM

## 2021-09-23 DIAGNOSIS — N393 Stress incontinence (female) (male): Secondary | ICD-10-CM | POA: Diagnosis not present

## 2021-09-23 LAB — URINALYSIS, ROUTINE W REFLEX MICROSCOPIC
Bilirubin, UA: NEGATIVE
Glucose, UA: NEGATIVE
Leukocytes,UA: NEGATIVE
Nitrite, UA: NEGATIVE
Protein,UA: NEGATIVE
RBC, UA: NEGATIVE
Specific Gravity, UA: 1.025 (ref 1.005–1.030)
Urobilinogen, Ur: 0.2 mg/dL (ref 0.2–1.0)
pH, UA: 5 (ref 5.0–7.5)

## 2021-09-23 MED ORDER — MIRABEGRON ER 50 MG PO TB24: 50.0000 mg | ORAL_TABLET | Freq: Every day | ORAL | 0 refills | Status: AC

## 2021-09-23 NOTE — Progress Notes (Signed)
Assessment: 1. Male stress incontinence   2. Urge incontinence   3. Prostate cancer ([place]); Gleason group 3, pT2N0Mx; s/p RALP 12/21   4. Erectile dysfunction after radical prostatectomy      Plan: I reviewed the patient's extensive records from the [place] regarding his diagnosis of prostate cancer and prior management of his incontinence. I discussed the diagnosis of both stress and urge incontinence.  Management options for post prostatectomy stress incontinence including pelvic floor physical therapy, Cunningham clamp, and surgical management with male sling or artificial urinary sphincter reviewed.  I also discussed management options for the urge component including avoidance of dietary irritants, behavioral therapy, medical therapy, neuromodulation, and chemodenervation.  I discussed the potential need for further evaluation with cystoscopy and urodynamics.  Since I do not perform the male sling procedure, I have recommended further evaluation by my colleague Dr. Nicki Reaper Macdiarmid. Trial of Myrbetriq 50 mg daily.  Samples given.  Use and side effects discussed. Referral to Dr. Bjorn Loser at [facility] Return prn  I personally spent 45 minutes involved in face to face and non-face-to-face activities for this patient on the day of the visit.  Professional time spent included the following activities, in addition to those noted in the documentation: Review of [facility] records, discussion of management options for stress and urge incontinence, recommendations for referral for further evaluation and management.   Chief Complaint:  Chief Complaint  Patient presents with   Urinary Incontinence     History of Present Illness:  Steve Bryan. is a 60 y.o. year old male who is seen in consultation from [place] for evaluation of post-prostatectomy incontinence.  He underwent a robotic assisted laparoscopic prostatectomy at the [place] in December 2021.  Pathology showed Gleason 4+3  adenocarcinoma, pT2N0 with negative margins.  His most recent PSA from 6/22 was <0.010.  He has had significant urinary incontinence following the procedure.  He reports stress incontinence associated with coughing, sneezing, bending, lifting, and straining.  He also reports continuous incontinence during the day and urge incontinence at night.  He is using 10-12 depends per day.  He has previously tried pelvic floor physical therapy without benefit.  He was on Ditropan for approximately 3 months following his surgery.  He is no longer on any medical therapy for his incontinence.  He does void with a good stream, primarily at night.  No dysuria or gross hematuria.  He also reports erectile dysfunction since the surgery.  He is unable to achieve an adequate erection for intercourse.  He experienced a headache with sildenafil and tadalafil.  He did not have success with a vacuum erection device.  He was given a trial of Edex but noted severe pain following the injection.   Past Medical History:  Past Medical History:  Diagnosis Date   Chronic pain syndrome    Diabetes mellitus without complication (HCC)    Diabetic neuropathy (HCC)    Fibromyalgia    GERD (gastroesophageal reflux disease)    Hypertension    Hypertriglyceridemia    Left retinopathy of prematurity    Sleep apnea    Varicose veins of both lower extremities     Past Surgical History:  Past Surgical History:  Procedure Laterality Date   COLONOSCOPY N/A 06/19/2014   Procedure: COLONOSCOPY;  Surgeon: Rogene Houston, MD;  Location: [facility];  Service: Endoscopy;  Laterality: N/A;  930   Right Inguinal Hernia Repair     RIGHT/LEFT HEART CATH AND CORONARY ANGIOGRAPHY  N/A 02/28/2017   Procedure: Right/Left Heart Cath and Coronary Angiography;  Surgeon: Jettie Booze, MD;  Location: [facility];  Service: Cardiovascular;  Laterality: N/A;    Allergies:  Allergies  Allergen Reactions   Effexor [Venlafaxine] Other  (See Comments)    Night sweats   Lisinopril Cough    Family History:  Family History  Problem Relation Age of Onset   Colon cancer Neg Hx     Social History:  Social History   Tobacco Use   Smoking status: Never   Smokeless tobacco: Never  Substance Use Topics   Alcohol use: No   Drug use: No    Review of symptoms:  Constitutional:  Negative for unexplained weight loss, night sweats, fever, chills ENT:  Negative for nose bleeds, sinus pain, painful swallowing CV:  Negative for chest pain, shortness of breath, exercise intolerance, palpitations, loss of consciousness Resp:  Negative for cough, wheezing, shortness of breath GI:  Negative for nausea, vomiting, diarrhea, bloody stools GU:  Positives noted in HPI; otherwise negative for gross hematuria, dysuria Neuro:  Negative for seizures, poor balance, limb weakness, slurred speech Psych:  Negative for lack of energy, depression, anxiety Endocrine:  Negative for polydipsia, polyuria, symptoms of hypoglycemia (dizziness, hunger, sweating) Hematologic:  Negative for anemia, purpura, petechia, prolonged or excessive bleeding, use of anticoagulants  Allergic:  Negative for difficulty breathing or choking as a result of exposure to anything; no shellfish allergy; no allergic response (rash/itch) to materials, foods  Physical exam: BP 124/76   Pulse 87   Temp 98.5 F (36.9 C)   Ht 6\' 1"  (1.854 m)   Wt 245 lb 9.6 oz (111.4 kg)   BMI 32.40 kg/m  GENERAL APPEARANCE:  Well appearing, well developed, well nourished, NAD HEENT: Atraumatic, Normocephalic, oropharynx clear. NECK: Supple without lymphadenopathy or thyromegaly. LUNGS: Clear to auscultation bilaterally. HEART: Regular Rate and Rhythm without murmurs, gallops, or rubs. ABDOMEN: Soft, non-tender, No Masses.  Surgical scars in lower abdomen well-healed.  Small umbilical hernia. EXTREMITIES: Moves all extremities well.  Without clubbing, cyanosis, or edema. NEUROLOGIC:   Alert and oriented x 3, normal gait, CN II-XII grossly intact.  MENTAL STATUS:  Appropriate. BACK:  Non-tender to palpation.  No CVAT SKIN:  Warm, dry and intact.   GU: Penis:  circumcised Meatus: Normal Scrotum: normal, no masses Testis: right atrophic, left normal   Results: Results for orders placed or performed in visit on 09/23/21 (from the past 24 hour(s))  Urinalysis, Routine w reflex microscopic   Collection Time: 09/23/21  3:19 PM  Result Value Ref Range   Specific Gravity, UA 1.025 1.005 - 1.030   pH, UA 5.0 5.0 - 7.5   Color, UA Yellow Yellow   Appearance Ur Clear Clear   Leukocytes,UA Negative Negative   Protein,UA Negative Negative/Trace   Glucose, UA Negative Negative   Ketones, UA Trace (A) Negative   RBC, UA Negative Negative   Bilirubin, UA Negative Negative   Urobilinogen, Ur 0.2 0.2 - 1.0 mg/dL   Nitrite, UA Negative Negative   Microscopic Examination Comment     PVR:  0 ml

## 2021-09-23 NOTE — Progress Notes (Signed)
Urological Symptom Review  Patient is experiencing the following symptoms: Frequent urination Leakage of urine Injury to kidneys/bladder Erection problems (male only)   Review of Systems  Gastrointestinal (upper)  : Indigestion/heartburn  Gastrointestinal (lower) : Negative for lower GI symptoms  Constitutional : Fatigue  Skin: Negative for skin symptoms  Eyes: Negative for eye symptoms  Ear/Nose/Throat : Sinus problems  Hematologic/Lymphatic: Easy bruising  Cardiovascular : Negative for cardiovascular symptoms  Respiratory : Cough  Endocrine: Negative for endocrine symptoms  Musculoskeletal: Back pain Joint pain  Neurological: Negative for neurological symptoms  Psychologic: Depression Anxiety

## 2021-09-24 ENCOUNTER — Ambulatory Visit: Payer: Non-veteran care | Admitting: Urology

## 2021-10-07 ENCOUNTER — Encounter (INDEPENDENT_AMBULATORY_CARE_PROVIDER_SITE_OTHER): Payer: Self-pay

## 2021-10-20 ENCOUNTER — Ambulatory Visit (HOSPITAL_COMMUNITY): Payer: No Typology Code available for payment source | Admitting: Anesthesiology

## 2021-10-20 ENCOUNTER — Encounter (HOSPITAL_COMMUNITY)
Admission: RE | Disposition: A | Discharge status: Home / Self Care | Payer: Self-pay | Source: Home / Self Care | Attending: Gastroenterology

## 2021-10-20 ENCOUNTER — Ambulatory Visit (HOSPITAL_COMMUNITY)
Admission: RE | Admit: 2021-10-20 | Discharge: 2021-10-20 | Disposition: A | Discharge status: Home / Self Care | Payer: No Typology Code available for payment source | Attending: Gastroenterology | Admitting: Gastroenterology

## 2021-10-20 ENCOUNTER — Encounter (HOSPITAL_COMMUNITY): Payer: Self-pay | Admitting: Gastroenterology

## 2021-10-20 ENCOUNTER — Other Ambulatory Visit: Payer: Self-pay

## 2021-10-20 DIAGNOSIS — E1140 Type 2 diabetes mellitus with diabetic neuropathy, unspecified: Secondary | ICD-10-CM | POA: Diagnosis not present

## 2021-10-20 DIAGNOSIS — K317 Polyp of stomach and duodenum: Secondary | ICD-10-CM | POA: Diagnosis not present

## 2021-10-20 DIAGNOSIS — Z888 Allergy status to other drugs, medicaments and biological substances status: Secondary | ICD-10-CM | POA: Insufficient documentation

## 2021-10-20 DIAGNOSIS — D123 Benign neoplasm of transverse colon: Secondary | ICD-10-CM | POA: Diagnosis not present

## 2021-10-20 DIAGNOSIS — Z794 Long term (current) use of insulin: Secondary | ICD-10-CM | POA: Insufficient documentation

## 2021-10-20 DIAGNOSIS — K648 Other hemorrhoids: Secondary | ICD-10-CM

## 2021-10-20 DIAGNOSIS — Z7984 Long term (current) use of oral hypoglycemic drugs: Secondary | ICD-10-CM | POA: Diagnosis not present

## 2021-10-20 DIAGNOSIS — D122 Benign neoplasm of ascending colon: Secondary | ICD-10-CM | POA: Diagnosis not present

## 2021-10-20 DIAGNOSIS — D124 Benign neoplasm of descending colon: Secondary | ICD-10-CM

## 2021-10-20 DIAGNOSIS — Z79899 Other long term (current) drug therapy: Secondary | ICD-10-CM | POA: Insufficient documentation

## 2021-10-20 DIAGNOSIS — Z7982 Long term (current) use of aspirin: Secondary | ICD-10-CM | POA: Insufficient documentation

## 2021-10-20 DIAGNOSIS — K2289 Other specified disease of esophagus: Secondary | ICD-10-CM | POA: Diagnosis not present

## 2021-10-20 DIAGNOSIS — R1032 Left lower quadrant pain: Secondary | ICD-10-CM | POA: Diagnosis not present

## 2021-10-20 DIAGNOSIS — K219 Gastro-esophageal reflux disease without esophagitis: Secondary | ICD-10-CM | POA: Diagnosis not present

## 2021-10-20 HISTORY — PX: BIOPSY: SHX5522

## 2021-10-20 HISTORY — PX: ESOPHAGOGASTRODUODENOSCOPY (EGD) WITH PROPOFOL: SHX5813

## 2021-10-20 HISTORY — PX: POLYPECTOMY: SHX5525

## 2021-10-20 HISTORY — PX: COLONOSCOPY WITH PROPOFOL: SHX5780

## 2021-10-20 LAB — HM COLONOSCOPY

## 2021-10-20 LAB — GLUCOSE, CAPILLARY: Glucose-Capillary: 137 mg/dL — ABNORMAL HIGH (ref 70–99)

## 2021-10-20 SURGERY — COLONOSCOPY WITH PROPOFOL
Anesthesia: General

## 2021-10-20 MED ORDER — PROPOFOL 500 MG/50ML IV EMUL
INTRAVENOUS | Status: DC | PRN
  Administered 2021-10-20: 150 ug/kg/min via INTRAVENOUS

## 2021-10-20 MED ORDER — PHENYLEPHRINE 40 MCG/ML (10ML) SYRINGE FOR IV PUSH (FOR BLOOD PRESSURE SUPPORT)
PREFILLED_SYRINGE | INTRAVENOUS | Status: DC | PRN
  Administered 2021-10-20: 80 ug via INTRAVENOUS

## 2021-10-20 MED ORDER — PROPOFOL 10 MG/ML IV BOLUS
INTRAVENOUS | Status: DC | PRN
  Administered 2021-10-20 (×4): 50 mg via INTRAVENOUS
  Administered 2021-10-20: 100 mg via INTRAVENOUS

## 2021-10-20 MED ORDER — LACTATED RINGERS IV SOLN: INTRAVENOUS | Status: DC

## 2021-10-20 MED ORDER — PHENYLEPHRINE 40 MCG/ML (10ML) SYRINGE FOR IV PUSH (FOR BLOOD PRESSURE SUPPORT)
PREFILLED_SYRINGE | INTRAVENOUS | Status: AC
  Filled 2021-10-20: qty 10

## 2021-10-20 MED ORDER — LIDOCAINE HCL (CARDIAC) PF 100 MG/5ML IV SOSY
PREFILLED_SYRINGE | INTRAVENOUS | Status: DC | PRN
  Administered 2021-10-20: 50 mg via INTRAVENOUS

## 2021-10-20 NOTE — Op Note (Signed)
[facility] Patient Name: Steve Bryan Procedure Date: 10/20/2021 10:50 AM MRN: [id number] Date of Birth: [birth year] Attending MD: Maylon Peppers ,  CSN: [id number] Age: 60 Admit Type: Outpatient Procedure:                Colonoscopy Indications:              Abdominal pain in the left lower quadrant Providers:                Maylon Peppers, Lambert Mody, Kristine L.                            Risa Grill, Technician Referring MD:              Medicines:                Monitored Anesthesia Care Complications:            No immediate complications. Estimated Blood Loss:     Estimated blood loss: none. Procedure:                Pre-Anesthesia Assessment:                           - Prior to the procedure, a History and Physical                            was performed, and patient medications, allergies                            and sensitivities were reviewed. The patient's                            tolerance of previous anesthesia was reviewed.                           - The risks and benefits of the procedure and the                            sedation options and risks were discussed with the                            patient. All questions were answered and informed                            consent was obtained.                           - ASA Grade Assessment: II - A patient with mild                            systemic disease.                           After obtaining informed consent, the colonoscope                            was passed under direct vision. Throughout the  procedure, the patient's blood pressure, pulse, and                            oxygen saturations were monitored continuously. The                            [id number]-HQ190L ([id number]) scope was introduced through                            the anus and advanced to the the terminal ileum.                            The colonoscopy was performed without difficulty.                             The patient tolerated the procedure well. The                            quality of the bowel preparation was adequate to                            identify polyps. Scope In: 10:53:13 AM Scope Out: 11:33:54 AM Scope Withdrawal Time: 0 hours 34 minutes 35 seconds  Total Procedure Duration: 0 hours 40 minutes 41 seconds  Findings:      The perianal and digital rectal examinations were normal.      Five sessile polyps were found in the descending colon, transverse colon       and ascending colon. The polyps were 3 to 6 mm in size. These polyps       were removed with a cold snare. Resection and retrieval were complete.      Non-bleeding internal hemorrhoids were found during retroflexion. The       hemorrhoids were small.      The terminal ileum appeared normal. Impression:               - Five 3 to 6 mm polyps in the descending colon, in                            the transverse colon and in the ascending colon,                            removed with a cold snare. Resected and retrieved.                           - Non-bleeding internal hemorrhoids.                           - The examined portion of the ileum was normal. Moderate Sedation:      Per Anesthesia Care Recommendation:           - Discharge patient to home (ambulatory).                           - Resume previous diet.                           -  Await pathology results.                           - Repeat colonoscopy date to be determined after                            pending pathology results are reviewed for                            surveillance. Procedure Code(s):        --- Professional ---                           [phone], Colonoscopy, flexible; with removal of                            tumor(s), polyp(s), or other lesion(s) by snare                            technique Diagnosis Code(s):        --- Professional ---                           K63.5, Polyp of colon                            K64.8, Other hemorrhoids                           R10.32, Left lower quadrant pain CPT copyright 2019 American Medical Association. All rights reserved. The codes documented in this report are preliminary and upon coder review may  be revised to meet current compliance requirements. Maylon Peppers, MD Maylon Peppers,  10/20/2021 11:40:28 AM This report has been signed electronically. Number of Addenda: 0

## 2021-10-20 NOTE — Discharge Instructions (Addendum)
You are being discharged to home.  Resume your previous diet.  We are waiting for your pathology results.  Continue omeprazole 20 mg qday Your physician has recommended a repeat colonoscopy (date to be determined after pending pathology results are reviewed) for surveillance.

## 2021-10-20 NOTE — Anesthesia Postprocedure Evaluation (Signed)
Anesthesia Post Note  Patient: Steve Bryan.  Procedure(s) Performed: COLONOSCOPY WITH PROPOFOL ESOPHAGOGASTRODUODENOSCOPY (EGD) WITH PROPOFOL BIOPSY POLYPECTOMY  Patient location during evaluation: Endoscopy Anesthesia Type: General Level of consciousness: awake and alert and oriented Pain management: pain level controlled Vital Signs Assessment: post-procedure vital signs reviewed and stable Respiratory status: spontaneous breathing, nonlabored ventilation and respiratory function stable Cardiovascular status: blood pressure returned to baseline and stable Postop Assessment: no apparent nausea or vomiting Anesthetic complications: no   No notable events documented.   Last Vitals:  Vitals:   10/20/21 0920 10/20/21 1137  BP: 136/75 117/65  Pulse: 81 75  Resp: 20 19  Temp: 36.8 C 36.6 C  SpO2: 97% 96%    Last Pain:  Vitals:   10/20/21 1137  TempSrc: Oral  PainSc: 0-No pain                 Anaiz Qazi C Lacara Dunsworth

## 2021-10-20 NOTE — Op Note (Addendum)
[facility] Patient Name: Steve Bryan Procedure Date: 10/20/2021 10:19 AM MRN: [id number] Date of Birth: 03-23-1961 Attending MD: Maylon Peppers ,  CSN: [id number] Age: 60 Admit Type: Outpatient Procedure:                Upper GI endoscopy Indications:              Gastro-esophageal reflux disease Providers:                Maylon Peppers, Lambert Mody, Kristine L.                            Risa Grill, Technician Referring MD:              Medicines:                Monitored Anesthesia Care Complications:            No immediate complications. Estimated Blood Loss:     Estimated blood loss: none. Procedure:                Pre-Anesthesia Assessment:                           - Prior to the procedure, a History and Physical                            was performed, and patient medications, allergies                            and sensitivities were reviewed. The patient's                            tolerance of previous anesthesia was reviewed.                           - The risks and benefits of the procedure and the                            sedation options and risks were discussed with the                            patient. All questions were answered and informed                            consent was obtained.                           - ASA Grade Assessment: II - A patient with mild                            systemic disease.                           After obtaining informed consent, the endoscope was                            passed under direct vision. Throughout the  procedure, the patient's blood pressure, pulse, and                            oxygen saturations were monitored continuously. The                            [id number]-[id number] ([id number]) scope was introduced through the                            mouth, and advanced to the second part of duodenum.                            The upper GI endoscopy was accomplished without                             difficulty. The patient tolerated the procedure                            well. Scope In: 10:35:23 AM Scope Out: 10:47:58 AM Total Procedure Duration: 0 hours 12 minutes 35 seconds  Findings:      One tongue of salmon-colored mucosa was present from 39 to 40 cm.       Squamous islands were present from 39 to 40 cm. The maximum longitudinal       extent of these esophageal mucosal changes was 1 cm in length. Biopsies       were taken with a cold forceps for histology.      A few small sessile fundic gland polyps with no bleeding were found in       the gastric fundus.      The examined duodenum was normal. Biopsies were taken with a cold       forceps for histology. Impression:               - Salmon-colored mucosa suspicious for                            short-segment Barrett's esophagus. Biopsied.                           - A few fundic gland polyps.                           - Normal examined duodenum. Biopsied. Moderate Sedation:      Per Anesthesia Care Recommendation:           - Discharge patient to home (ambulatory).                           - Resume previous diet.                           - Await pathology results.                           - Continue omeprazole 20 mg qday Procedure Code(s):        --- Professional ---  41324, Esophagogastroduodenoscopy, flexible,                            transoral; with biopsy, single or multiple Diagnosis Code(s):        --- Professional ---                           K22.8, Other specified diseases of esophagus                           K31.7, Polyp of stomach and duodenum                           K21.9, Gastro-esophageal reflux disease without                            esophagitis CPT copyright 2019 American Medical Association. All rights reserved. The codes documented in this report are preliminary and upon coder review may  be revised to meet current compliance  requirements. Maylon Peppers, MD Maylon Peppers,  10/20/2021 10:51:11 AM This report has been signed electronically. Number of Addenda: 0

## 2021-10-20 NOTE — Anesthesia Preprocedure Evaluation (Addendum)
Anesthesia Evaluation  Patient identified by MRN, date of birth, ID band Patient awake    Reviewed: Allergy & Precautions, NPO status , Patient's Chart, lab work & pertinent test results, reviewed documented beta blocker date and time   Airway Mallampati: II  TM Distance: >3 FB Neck ROM: Full    Dental  (+) Dental Advisory Given, Missing   Pulmonary sleep apnea and Continuous Positive Airway Pressure Ventilation ,    Pulmonary exam normal breath sounds clear to auscultation       Cardiovascular Exercise Tolerance: Good hypertension, Pt. on medications and Pt. on home beta blockers + Past MI  Normal cardiovascular exam Rhythm:Regular Rate:Normal     Neuro/Psych  Neuromuscular disease negative psych ROS   GI/Hepatic Neg liver ROS, GERD  Medicated and Controlled,  Endo/Other  diabetes, Well Controlled, Type 2, Oral Hypoglycemic Agents, Insulin Dependent  Renal/GU negative Renal ROS     Musculoskeletal  (+) Fibromyalgia -  Abdominal   Peds  Hematology   Anesthesia Other Findings   Reproductive/Obstetrics                           Anesthesia Physical Anesthesia Plan  ASA: 3  Anesthesia Plan: General   Post-op Pain Management:    Induction: Intravenous  PONV Risk Score and Plan: TIVA  Airway Management Planned: Nasal Cannula and Natural Airway  Additional Equipment:   Intra-op Plan:   Post-operative Plan:   Informed Consent: I have reviewed the patients History and Physical, chart, labs and discussed the procedure including the risks, benefits and alternatives for the proposed anesthesia with the patient or authorized representative who has indicated his/her understanding and acceptance.     Dental advisory given  Plan Discussed with: CRNA and Surgeon  Anesthesia Plan Comments:        Anesthesia Quick Evaluation

## 2021-10-20 NOTE — Anesthesia Procedure Notes (Signed)
Date/Time: 10/20/2021 10:33 AM Performed by: Orlie Dakin, CRNA Pre-anesthesia Checklist: Patient identified, Emergency Drugs available, Suction available and Patient being monitored Patient Re-evaluated:Patient Re-evaluated prior to induction Oxygen Delivery Method: Nasal cannula Induction Type: IV induction Placement Confirmation: positive ETCO2

## 2021-10-20 NOTE — Transfer of Care (Signed)
Immediate Anesthesia Transfer of Care Note  Patient: Steve Bryan.  Procedure(s) Performed: COLONOSCOPY WITH PROPOFOL ESOPHAGOGASTRODUODENOSCOPY (EGD) WITH PROPOFOL BIOPSY POLYPECTOMY  Patient Location: Endoscopy Unit  Anesthesia Type:General  Level of Consciousness: awake and oriented  Airway & Oxygen Therapy: Patient Spontanous Breathing  Post-op Assessment: Report given to RN and Post -op Vital signs reviewed and stable  Post vital signs: Reviewed and stable  Last Vitals:  Vitals Value Taken Time  BP    Temp    Pulse    Resp    SpO2      Last Pain:  Vitals:   10/20/21 1029  TempSrc:   PainSc: 5       Patients Stated Pain Goal: 8 (82/51/89 8421)  Complications: No notable events documented.

## 2021-10-20 NOTE — H&P (Signed)
Steve Bryan. is an 60 y.o. male.   Chief Complaint: lower abdominal pain and GERD  HPI: Steve Bryan is a 60 y.o. male with past medical history of diabetes, diabetic neuropathy, fibromyalgia, GERD, hypertension, hyperlipidemia, chronic pain syndrome, PTSD, depression, who presents for evaluation of abdominal pain for follow-up of GERD.  Patient had previous episodes of abdominal pain located in the lower abdomen and the left lower quadrant.  These episodes started after he has had prostatectomy.  Had intermittent constipation and bloating.  However he reported the abdominal pain has improved.  Had previously performed a CT of the abdomen and pelvis with IV contrast on 04/06/2021 that was unremarkable for any alterations.  He has not implemented any dietary changes. KUB was normal.  Patient reports that his heartburn is well controlled while taking omeprazole 20 mg every day.  Denies having any dysphagia or odynophagia, no heartburn or regurgitation.  Past Medical History:  Diagnosis Date   Chronic pain syndrome    Diabetes mellitus without complication (HCC)    Diabetic neuropathy (HCC)    Fibromyalgia    GERD (gastroesophageal reflux disease)    Hypertension    Hypertriglyceridemia    Left retinopathy of prematurity    Sleep apnea    Varicose veins of both lower extremities     Past Surgical History:  Procedure Laterality Date   COLONOSCOPY N/A 06/19/2014   Procedure: COLONOSCOPY;  Surgeon: Rogene Houston, MD;  [facility];  Service: Endoscopy;  Laterality: N/A;  930   PROSTATECTOMY     Right Inguinal Hernia Repair     RIGHT/LEFT HEART CATH AND CORONARY ANGIOGRAPHY N/A 02/28/2017   Procedure: Right/Left Heart Cath and Coronary Angiography;  Surgeon: Jettie Booze, MD;  Location: [place] [facility];  Service: Cardiovascular;  Laterality: N/A;    Family History  Problem Relation Age of Onset   Colon cancer Neg Hx    Social History:  reports that he has  never smoked. He has never used smokeless tobacco. He reports that he does not drink alcohol and does not use drugs.  Allergies:  Allergies  Allergen Reactions   Effexor [Venlafaxine] Other (See Comments)    Night sweats   Lisinopril Cough    Medications Prior to Admission  Medication Sig Dispense Refill   allopurinol (ZYLOPRIM) 300 MG tablet Take 300 mg by mouth in the morning.     aspirin EC 81 MG EC tablet Take 1 tablet (81 mg total) by mouth daily.     atorvastatin (LIPITOR) 80 MG tablet Take 80 mg by mouth in the morning.     carvedilol (COREG) 25 MG tablet Take 25 mg by mouth in the morning and at bedtime.     cetirizine (ZYRTEC) 10 MG tablet Take 10 mg by mouth at bedtime.     DULoxetine (CYMBALTA) 60 MG capsule Take 120 mg by mouth in the morning.     ergocalciferol (VITAMIN D2) 50000 UNITS capsule Take 50,000 Units by mouth every Monday.     famotidine (PEPCID) 40 MG tablet Take 40 mg by mouth at bedtime.     gabapentin (NEURONTIN) 100 MG capsule Take 100 mg by mouth at bedtime.     insulin glargine (LANTUS) 100 UNIT/ML injection Inject 22 Units into the skin in the morning.     ipratropium (ATROVENT) 0.06 % nasal spray Place 1-2 sprays into both nostrils 4 (four) times daily as needed (allergies).     Lactobacillus (ACIDOPHILUS PO) Take  1 capsule by mouth in the morning and at bedtime.     lamoTRIgine (LAMICTAL) 100 MG tablet Take 150 mg by mouth in the morning.     losartan (COZAAR) 100 MG tablet Take 50 mg by mouth in the morning.     Magnesium Oxide 420 MG TABS Take 420 mg by mouth in the morning.     melatonin 3 MG TABS tablet Take 9 mg by mouth at bedtime.     metFORMIN (GLUCOPHAGE) 1000 MG tablet Take 1,000 mg by mouth in the morning and at bedtime.     methocarbamol (ROBAXIN) 500 MG tablet Take 500 mg by mouth 3 (three) times daily.     mirabegron ER (MYRBETRIQ) 50 MG TB24 tablet Take 1 tablet (50 mg total) by mouth daily. 28 tablet 0   Multiple Vitamin (MULTIVITAMIN  WITH MINERALS) TABS tablet Take 1 tablet by mouth in the morning.     omeprazole (PRILOSEC) 20 MG capsule Take 40 mg by mouth at bedtime.     polyethylene glycol-electrolytes (TRILYTE) 420 g solution Take 4,000 mLs by mouth as directed. 4000 mL 0   QUEtiapine (SEROQUEL) 100 MG tablet Take 50 mg by mouth at bedtime.     Semaglutide (OZEMPIC, 2 MG/DOSE, [place]) Inject 2 mg into the skin every Monday.     Semaglutide, 2 MG/DOSE, (OZEMPIC, 2 MG/DOSE,) 8 MG/3ML SOPN Inject 2 mg into the skin every Monday.     acetaminophen (TYLENOL) 500 MG tablet Take 1,000 mg by mouth every 6 (six) hours as needed (pain.).      Results for orders placed or performed during the hospital encounter of 10/20/21 (from the past 48 hour(s))  Glucose, capillary     Status: Abnormal   Collection Time: 10/20/21  9:27 AM  Result Value Ref Range   Glucose-Capillary 137 (H) 70 - 99 mg/dL    Comment: Glucose reference range applies only to samples taken after fasting for at least 8 hours.   No results found.  Review of Systems  Blood pressure 136/75, pulse 81, temperature 98.2 F (36.8 C), temperature source Oral, resp. rate 20, height 6\' 1"  (1.854 m), weight 106.6 kg, SpO2 97 %. Physical Exam  GENERAL: The patient is AO x3, in no acute distress. HEENT: Head is normocephalic and atraumatic. EOMI are intact. Mouth is well hydrated and without lesions. NECK: Supple. No masses LUNGS: Clear to auscultation. No presence of rhonchi/wheezing/rales. Adequate chest expansion HEART: RRR, normal s1 and s2. ABDOMEN: mild diffuse tenderness, no guarding, no peritoneal signs, and nondistended. BS +. No masses. EXTREMITIES: Without any cyanosis, clubbing, rash, lesions or edema. NEUROLOGIC: AOx3, no focal motor deficit. SKIN: no jaundice, no rashes  Assessment/Plan Steve Bryan is a 60 y.o. male with past medical history of diabetes, diabetic neuropathy, fibromyalgia, GERD, hypertension, hyperlipidemia, chronic pain syndrome, PTSD,  depression, who presents for evaluation of abdominal pain for follow-up of GERD.  We will proceed with EGD and colonoscopy.  [name], MD 10/20/2021, 9:46 AM

## 2021-10-21 LAB — SURGICAL PATHOLOGY

## 2021-10-22 ENCOUNTER — Encounter (HOSPITAL_COMMUNITY): Payer: Self-pay | Admitting: Gastroenterology

## 2021-10-25 ENCOUNTER — Encounter (INDEPENDENT_AMBULATORY_CARE_PROVIDER_SITE_OTHER): Payer: Self-pay | Admitting: *Deleted

## 2021-10-25 ENCOUNTER — Telehealth: Payer: Self-pay

## 2021-10-25 NOTE — Telephone Encounter (Signed)
PA form for Myrbetriq received.  Completed and faxed back to [place].  [facility], pt tried and failed oxybuytnin and pelvic floor therapy.

## 2021-11-03 ENCOUNTER — Telehealth: Payer: Self-pay | Admitting: Urology

## 2021-11-03 NOTE — Telephone Encounter (Signed)
Received communication from [place] at [facility] that the patient declined an appointment at [facility] for urodynamics study.

## 2022-03-22 ENCOUNTER — Encounter (INDEPENDENT_AMBULATORY_CARE_PROVIDER_SITE_OTHER): Payer: Self-pay | Admitting: Gastroenterology

## 2022-11-21 IMAGING — DX DG ABDOMEN 1V
2 series · 2 of 2 positions shown · non-contrast
Comparison: None.

CLINICAL DATA: Rule out constipation.  History of prostatectomy.

EXAM:
ABDOMEN - 1 VIEW

[abdomen kub (1 of 2)]
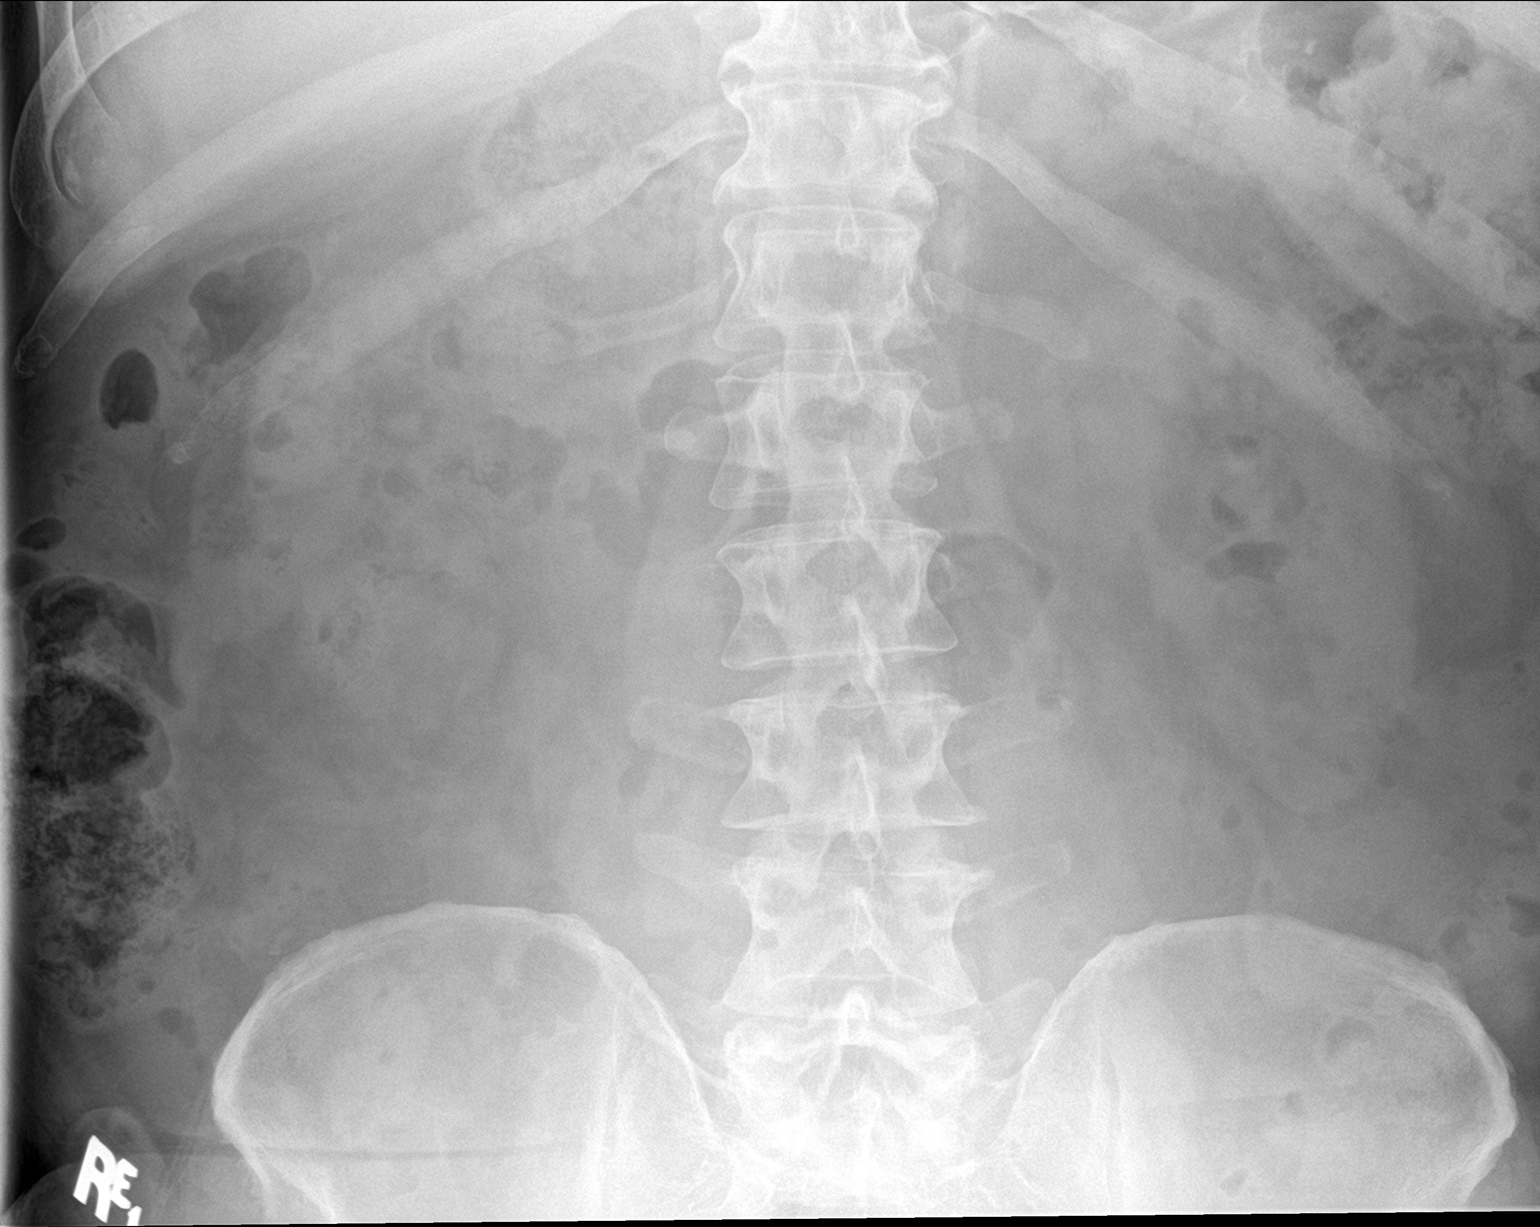

[abdomen kub (2 of 2)]
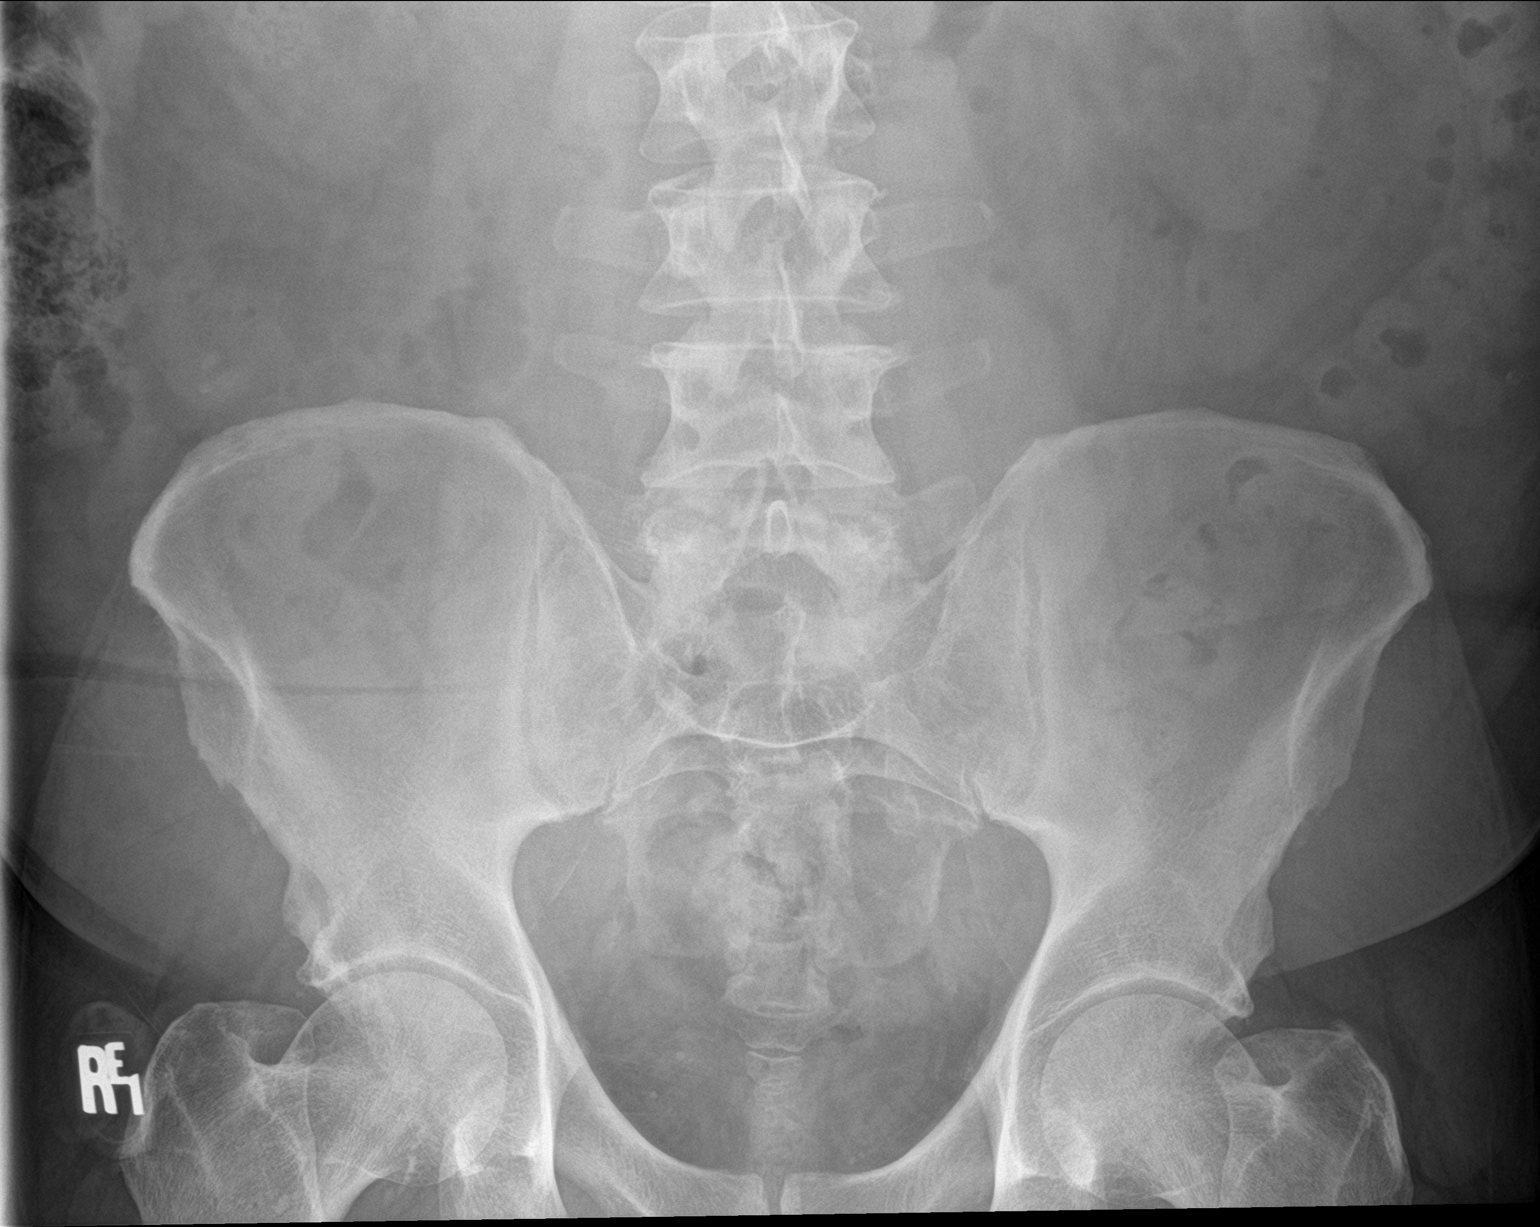

[2 of 2 positions shown; findings below may reference images not displayed]

FINDINGS: No convincing evidence of constipation. No abnormalities identified.
IMPRESSION: No convincing evidence of constipation. No significant abnormalities
noted.

## 2023-02-08 ENCOUNTER — Other Ambulatory Visit (HOSPITAL_COMMUNITY): Payer: Self-pay | Admitting: Chiropractic Medicine

## 2023-02-08 DIAGNOSIS — M25512 Pain in left shoulder: Secondary | ICD-10-CM

## 2023-02-08 DIAGNOSIS — M25619 Stiffness of unspecified shoulder, not elsewhere classified: Secondary | ICD-10-CM

## 2023-03-10 ENCOUNTER — Ambulatory Visit (HOSPITAL_COMMUNITY)
Admission: RE | Admit: 2023-03-10 | Discharge: 2023-03-10 | Disposition: A | Discharge status: Home / Self Care | Payer: No Typology Code available for payment source | Source: Ambulatory Visit | Attending: Chiropractic Medicine | Admitting: Chiropractic Medicine

## 2023-03-10 DIAGNOSIS — M25619 Stiffness of unspecified shoulder, not elsewhere classified: Secondary | ICD-10-CM | POA: Diagnosis present

## 2023-03-10 DIAGNOSIS — M25512 Pain in left shoulder: Secondary | ICD-10-CM | POA: Insufficient documentation

## 2023-04-04 ENCOUNTER — Other Ambulatory Visit (HOSPITAL_COMMUNITY): Payer: Self-pay | Admitting: Psychiatry

## 2023-04-04 DIAGNOSIS — I6529 Occlusion and stenosis of unspecified carotid artery: Secondary | ICD-10-CM

## 2023-04-24 ENCOUNTER — Ambulatory Visit (HOSPITAL_COMMUNITY): Payer: No Typology Code available for payment source | Attending: Psychiatry

## 2023-04-24 ENCOUNTER — Encounter (HOSPITAL_COMMUNITY): Payer: Self-pay

## 2023-05-19 ENCOUNTER — Ambulatory Visit (HOSPITAL_COMMUNITY)
Admission: RE | Admit: 2023-05-19 | Discharge: 2023-05-19 | Disposition: A | Discharge status: Home / Self Care | Payer: No Typology Code available for payment source | Source: Ambulatory Visit | Attending: Psychiatry | Admitting: Psychiatry

## 2023-05-19 DIAGNOSIS — I6529 Occlusion and stenosis of unspecified carotid artery: Secondary | ICD-10-CM | POA: Diagnosis present

## 2023-05-30 ENCOUNTER — Other Ambulatory Visit (HOSPITAL_COMMUNITY): Payer: Self-pay | Admitting: Internal Medicine

## 2023-05-30 DIAGNOSIS — R9389 Abnormal findings on diagnostic imaging of other specified body structures: Secondary | ICD-10-CM

## 2023-06-05 ENCOUNTER — Ambulatory Visit (HOSPITAL_COMMUNITY)
Admission: RE | Admit: 2023-06-05 | Discharge: 2023-06-05 | Disposition: A | Discharge status: Home / Self Care | Payer: No Typology Code available for payment source | Source: Ambulatory Visit | Attending: Internal Medicine | Admitting: Internal Medicine

## 2023-06-05 DIAGNOSIS — R9389 Abnormal findings on diagnostic imaging of other specified body structures: Secondary | ICD-10-CM | POA: Insufficient documentation

## 2023-06-05 MED ORDER — GADOBUTROL 1 MMOL/ML IV SOLN
10.0000 mL | Freq: Once | INTRAVENOUS | Status: AC | PRN
  Administered 2023-06-05: 10 mL via INTRAVENOUS

## 2023-06-19 ENCOUNTER — Inpatient Hospital Stay
Admission: RE | Admit: 2023-06-19 | Discharge: 2023-06-19 | Disposition: A | Discharge status: Home / Self Care | Payer: Self-pay | Source: Ambulatory Visit | Attending: Internal Medicine | Admitting: Internal Medicine

## 2023-06-19 ENCOUNTER — Other Ambulatory Visit (HOSPITAL_COMMUNITY): Payer: Self-pay | Admitting: Internal Medicine

## 2023-06-19 DIAGNOSIS — K219 Gastro-esophageal reflux disease without esophagitis: Secondary | ICD-10-CM

## 2023-08-09 ENCOUNTER — Other Ambulatory Visit (HOSPITAL_COMMUNITY): Payer: Self-pay | Admitting: Internal Medicine

## 2023-08-09 DIAGNOSIS — K8689 Other specified diseases of pancreas: Secondary | ICD-10-CM

## 2023-08-14 ENCOUNTER — Encounter (INDEPENDENT_AMBULATORY_CARE_PROVIDER_SITE_OTHER): Payer: Self-pay | Admitting: Gastroenterology

## 2023-08-14 ENCOUNTER — Ambulatory Visit (INDEPENDENT_AMBULATORY_CARE_PROVIDER_SITE_OTHER): Payer: No Typology Code available for payment source | Admitting: Gastroenterology

## 2023-08-14 VITALS — BP 108/66 | HR 99 | Temp 97.1°F | Ht 73.0 in | Wt 237.5 lb

## 2023-08-14 DIAGNOSIS — K219 Gastro-esophageal reflux disease without esophagitis: Secondary | ICD-10-CM | POA: Diagnosis not present

## 2023-08-14 DIAGNOSIS — K589 Irritable bowel syndrome without diarrhea: Secondary | ICD-10-CM | POA: Diagnosis not present

## 2023-08-14 DIAGNOSIS — D490 Neoplasm of unspecified behavior of digestive system: Secondary | ICD-10-CM | POA: Diagnosis not present

## 2023-08-14 NOTE — Patient Instructions (Addendum)
Try to implement low FODMAP diet as much as possible Start taking MiraLAX 1 capful daily Continue with docusate twice a day Proceed with the scheduled MRCP on Monday

## 2023-08-14 NOTE — Progress Notes (Signed)
[name], M.D. Gastroenterology & Hepatology [facility] [address] [place], [place] [address]  Primary Care Physician: Sondra Come, MD [address] [place] [address]  I will communicate my assessment and recommendations to the referring MD via EMR.  Problems: GERD IBS-M Sidebranch IPMN  History of Present Illness: Steve Bryan is a 62 y.o. 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  The patient was last seen on 08/23/2021. At that time, the patient was advised to follow a low FODMAP diet for abdominal pain/distention.  A KUB was ordered.  Esophagogastroduodenospy and colonoscopy were scheduled as well.  Patient reports that he is still having lower abdominal pain and radiating to his back. He has frequent tenesmus. He takes docusate twice a day.  He takes psyllium as well. Also has some nausea when he moves his bowels. He can fluctuate between diarrhea and constipation. Constipation can last for 5 days, when he has diarrhea can have multiple Bms.  The patient denies having any nausea, vomiting, fever, chills, hematochezia, melena, hematemesis, abdominal distention, jaundice, pruritus. Has lost 10 lb , has been on Ozempic and has been monitoring his sugars closely.  Notably, the patient had CT of the abdomen pelvis with IV contrast on 04/06/2021 which showed a normal pancreas.    Patient states that for some reason he had a CT of the abdomen and pelvis ordered by the [facility] physician (he does not remember why he had it ordered but it was not because of the abdominal pain).  Patient had this performed in early June 2024 and was told he was found to have lesion in his pancreas (no report available). He underwent an MRI of the abdomen with and without contrast on  06/05/2023 which showed a 1.5 x 1.3 x 1.7 cm nonenhancing lesion in the head of the pancreas which did not communicate with the main pancreatic duct, no presence of any internal abnormalities.  Lesion was suggestive of a sidebranch IPMN.  Recommended to have repeat MRCP in 6 months.  He states that he ask the staff at the [place] to have a repeat CT scan in July to address if this had grown in size and it measured 1.7 cm (again, no report is available).  However, the patient has been very anxious and asked his PCP to order MRCP which is scheduled for Monday.  Last EGD: 10/20/2021 1 tongue some on-call mucosa was present between 39 to 40 cm with some associated squamous islands (chronic inflammation but no Barrett's esophagus), few small polyps in the fundus, normal duodenum (normal biopsies). Last Colonoscopy: 10/20/2021 5 polyps removed from the ascending, transverse and ascending colon (5 tubular adenomas), normal terminal ileum, internal hemorrhoids. Repeat colonoscopy in 3 years  Past Medical History: Past Medical History:  Diagnosis Date   Chronic pain syndrome    Diabetes mellitus without complication (HCC)    Diabetic neuropathy (HCC)    Fibromyalgia    GERD (gastroesophageal reflux disease)    Hypertension    Hypertriglyceridemia    Left retinopathy of prematurity    Sleep apnea    Varicose veins of both lower extremities     Past Surgical History: Past Surgical History:  Procedure Laterality Date   BIOPSY  10/20/2021   Procedure: BIOPSY;  Surgeon: Dolores Frame, MD;  [facility];  Service: Gastroenterology;;   COLONOSCOPY N/A  06/19/2014   Procedure: COLONOSCOPY;  Surgeon: Malissa Hippo, MD;  [facility];  Service: Endoscopy;  Laterality: N/A;  930   COLONOSCOPY WITH PROPOFOL N/A 10/20/2021   Procedure: COLONOSCOPY WITH PROPOFOL;  Surgeon: Dolores Frame, MD;  [facility];  Service: Gastroenterology;  Laterality: N/A;   10:50   ESOPHAGOGASTRODUODENOSCOPY (EGD) WITH PROPOFOL N/A 10/20/2021   Procedure: ESOPHAGOGASTRODUODENOSCOPY (EGD) WITH PROPOFOL;  Surgeon: Dolores Frame, MD;  [facility];  Service: Gastroenterology;  Laterality: N/A;   POLYPECTOMY  10/20/2021   Procedure: POLYPECTOMY;  Surgeon: Dolores Frame, MD;  [facility];  Service: Gastroenterology;;   PROSTATECTOMY     Right Inguinal Hernia Repair     RIGHT/LEFT HEART CATH AND CORONARY ANGIOGRAPHY N/A 02/28/2017   Procedure: Right/Left Heart Cath and Coronary Angiography;  Surgeon: Corky Crafts, MD;  Location: [facility] INVASIVE CV LAB;  Service: Cardiovascular;  Laterality: N/A;    Family History: Family History  Problem Relation Age of Onset   Colon cancer Neg Hx     Social History: Social History   Tobacco Use  Smoking Status Never  Smokeless Tobacco Never   Social History   Substance and Sexual Activity  Alcohol Use No   Social History   Substance and Sexual Activity  Drug Use No    Allergies: Allergies  Allergen Reactions   Effexor [Venlafaxine] Other (See Comments)    Night sweats   Lisinopril Cough    Medications: Current Outpatient Medications  Medication Sig Dispense Refill   acetaminophen (TYLENOL) 500 MG tablet Take 1,000 mg by mouth every 6 (six) hours as needed (pain.).     allopurinol (ZYLOPRIM) 300 MG tablet Take 300 mg by mouth in the morning.     aspirin EC 81 MG EC tablet Take 1 tablet (81 mg total) by mouth daily.     atorvastatin (LIPITOR) 80 MG tablet Take 80 mg by mouth in the morning.     carvedilol (COREG) 25 MG tablet Take 25 mg by mouth in the morning and at bedtime.     DULoxetine (CYMBALTA) 60 MG capsule Take 120 mg by mouth in the morning.     ergocalciferol (VITAMIN D2) 50000 UNITS capsule Take 50,000 Units by mouth every Monday.     insulin glargine (LANTUS) 100 UNIT/ML injection Inject 22 Units into the skin in the morning.     ipratropium  (ATROVENT) 0.06 % nasal spray Place 1-2 sprays into both nostrils 4 (four) times daily as needed (allergies).     Lactobacillus (ACIDOPHILUS PO) Take 1 capsule by mouth in the morning and at bedtime.     lamoTRIgine (LAMICTAL) 100 MG tablet Take 150 mg by mouth in the morning.     losartan (COZAAR) 100 MG tablet Take 50 mg by mouth in the morning.     Magnesium Oxide 420 MG TABS Take 420 mg by mouth in the morning.     melatonin 3 MG TABS tablet Take 9 mg by mouth at bedtime.     metFORMIN (GLUCOPHAGE) 1000 MG tablet Take 1,000 mg by mouth in the morning and at bedtime.     methocarbamol (ROBAXIN) 500 MG tablet Take 500 mg by mouth 3 (three) times daily.     mirabegron ER (MYRBETRIQ) 50 MG TB24 tablet Take 1 tablet (50 mg total) by mouth daily. 28 tablet 0   Multiple Vitamin (MULTIVITAMIN WITH MINERALS) TABS tablet Take 1 tablet by mouth in the morning.  omeprazole (PRILOSEC) 20 MG capsule Take 40 mg by mouth 2 (two) times daily before a meal.     OXcarbazepine (TRILEPTAL) 150 MG tablet Take 300 mg by mouth 2 (two) times daily.     QUEtiapine (SEROQUEL) 100 MG tablet Take 50 mg by mouth at bedtime.     Semaglutide (OZEMPIC, 2 MG/DOSE, [place]) Inject 2 mg into the skin every Monday.     No current facility-administered medications for this visit.    Review of Systems: GENERAL: negative for malaise, night sweats HEENT: No changes in hearing or vision, no nose bleeds or other nasal problems. NECK: Negative for lumps, goiter, pain and significant neck swelling RESPIRATORY: Negative for cough, wheezing CARDIOVASCULAR: Negative for chest pain, leg swelling, palpitations, orthopnea GI: SEE HPI MUSCULOSKELETAL: Negative for joint pain or swelling, back pain, and muscle pain. SKIN: Negative for lesions, rash PSYCH: Negative for sleep disturbance, mood disorder and recent psychosocial stressors. HEMATOLOGY Negative for prolonged bleeding, bruising easily, and swollen nodes. ENDOCRINE: Negative for  cold or heat intolerance, polyuria, polydipsia and goiter. NEURO: negative for tremor, gait imbalance, syncope and seizures. The remainder of the review of systems is noncontributory.   Physical Exam: BP 108/66 (BP Location: Left Arm, Patient Position: Sitting, Cuff Size: Normal)   Pulse 99   Temp (!) 97.1 F (36.2 C) (Temporal)   Ht 6\' 1"  (1.854 m)   Wt 237 lb 8 oz (107.7 kg)   BMI 31.33 kg/m  GENERAL: The patient is AO x3, in no acute distress. HEENT: Head is normocephalic and atraumatic. EOMI are intact. Mouth is well hydrated and without lesions. NECK: Supple. No masses LUNGS: Clear to auscultation. No presence of rhonchi/wheezing/rales. Adequate chest expansion HEART: RRR, normal s1 and s2. ABDOMEN: Soft, nontender, no guarding, no peritoneal signs, and nondistended. BS +. No masses. EXTREMITIES: Without any cyanosis, clubbing, rash, lesions or edema. NEUROLOGIC: AOx3, no focal motor deficit. SKIN: no jaundice, no rashes  Imaging/Labs: as above  I personally reviewed and interpreted the available labs, imaging and endoscopic files.  Impression and Plan: THOMAS BUITRAGO is a 61 y.o. 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  The patient was found to have an IPMN incidentally when undergoing cross-sectional abdominal imaging.  There were no high risk features identified on the MRI performed at [facility], which is reassuring.  The IPMN is less than 3 cm in size and does not communicate to the main duct.  We had a very thorough discussion with the patient regarding the etiology and prognosis of pancreatic cysts.  We discussed that even though these are fairly common, there is a risk of malignancy progression, for which we will need to monitor his cyst with cross-sectional abdominal imaging.  He already has an  imaging modality scheduled for Monday which he will proceed with.  Depending on these findings, we will determine the next steps in the surveillance of his IPMN.  Reassuringly, he has not presented any red flag signs such as weight loss, worsening abdominal pain or new onset diabetes.  In terms of his IBS, this has been managed intermittently with diet.  I advised him to try to implement the dietary changes more thoroughly, as well as to optimize his bowel frequency with MiraLAX daily.  He should continue with docusate twice a day.  -Try to implement low FODMAP diet as much as possible - Start taking  MiraLAX 1 capful daily - Continue with docusate twice a day - Proceed with the scheduled MRCP on Monday  All questions were answered.      [name], MD Gastroenterology and Hepatology [facility]astroenterology

## 2023-08-22 ENCOUNTER — Ambulatory Visit (HOSPITAL_COMMUNITY)
Admission: RE | Admit: 2023-08-22 | Discharge: 2023-08-22 | Disposition: A | Discharge status: Home / Self Care | Payer: No Typology Code available for payment source | Source: Ambulatory Visit | Attending: Internal Medicine | Admitting: Internal Medicine

## 2023-08-22 DIAGNOSIS — K8689 Other specified diseases of pancreas: Secondary | ICD-10-CM | POA: Insufficient documentation

## 2023-08-22 MED ORDER — GADOBUTROL 1 MMOL/ML IV SOLN
10.0000 mL | Freq: Once | INTRAVENOUS | Status: AC | PRN
  Administered 2023-08-22: 10 mL via INTRAVENOUS

## 2023-08-24 ENCOUNTER — Telehealth (INDEPENDENT_AMBULATORY_CARE_PROVIDER_SITE_OTHER): Payer: Self-pay

## 2023-08-24 NOTE — Telephone Encounter (Signed)
Please let him know I looked at the images and the cyst seems to be similar in size. However, no official report from radiology is available and he will need to wait for this. He will need to notify us once the report is back. Thanks

## 2023-08-24 NOTE — Telephone Encounter (Signed)
Patient called stating he had a MR ABD w/ wo contrast on 08/22/2023. He wants to know if you have had a chance to look at the scan to see the pancreatic lesion. It does not look as if the scan has been read.

## 2023-08-24 NOTE — Telephone Encounter (Signed)
I called and left a message asked that the patient please return call to the office. 

## 2023-08-29 NOTE — Telephone Encounter (Signed)
1 yr MRCP noted in recall ?

## 2023-08-29 NOTE — Telephone Encounter (Signed)
Spoke to the patient today, pancreatic cyst seems to be stable IPMN - 15 mm in size.  Will continue monitoring with MRCP in 1 year. Ann, please add for recall.

## 2023-08-29 NOTE — Telephone Encounter (Signed)
Results are back please see below.  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 No interval change from recent MR. Follow-up MR abdomen with/without contrast is suggested in 1 year.

## 2023-09-29 ENCOUNTER — Other Ambulatory Visit: Payer: Self-pay | Admitting: Internal Medicine

## 2023-09-29 DIAGNOSIS — G450 Vertebro-basilar artery syndrome: Secondary | ICD-10-CM

## 2023-10-05 ENCOUNTER — Ambulatory Visit (HOSPITAL_COMMUNITY)
Admission: RE | Admit: 2023-10-05 | Discharge: 2023-10-05 | Disposition: A | Discharge status: Home / Self Care | Payer: No Typology Code available for payment source | Source: Ambulatory Visit | Attending: Internal Medicine | Admitting: Internal Medicine

## 2023-10-05 DIAGNOSIS — G450 Vertebro-basilar artery syndrome: Secondary | ICD-10-CM | POA: Diagnosis present

## 2023-11-16 ENCOUNTER — Other Ambulatory Visit (HOSPITAL_COMMUNITY): Payer: Self-pay | Admitting: Internal Medicine

## 2023-11-16 DIAGNOSIS — H8190 Unspecified disorder of vestibular function, unspecified ear: Secondary | ICD-10-CM

## 2023-11-22 ENCOUNTER — Ambulatory Visit (HOSPITAL_COMMUNITY): Payer: No Typology Code available for payment source

## 2023-11-29 ENCOUNTER — Ambulatory Visit (HOSPITAL_COMMUNITY)
Admission: RE | Admit: 2023-11-29 | Discharge: 2023-11-29 | Disposition: A | Discharge status: Home / Self Care | Payer: No Typology Code available for payment source | Source: Ambulatory Visit | Attending: Internal Medicine | Admitting: Internal Medicine

## 2023-11-29 DIAGNOSIS — H8190 Unspecified disorder of vestibular function, unspecified ear: Secondary | ICD-10-CM | POA: Diagnosis present

## 2023-11-29 MED ORDER — GADOBUTROL 1 MMOL/ML IV SOLN
10.0000 mL | Freq: Once | INTRAVENOUS | Status: AC | PRN
  Administered 2023-11-29: 10 mL via INTRAVENOUS

## 2023-11-30 ENCOUNTER — Other Ambulatory Visit (HOSPITAL_COMMUNITY): Payer: Self-pay | Admitting: Internal Medicine

## 2023-11-30 DIAGNOSIS — Z0189 Encounter for other specified special examinations: Secondary | ICD-10-CM

## 2023-12-22 ENCOUNTER — Other Ambulatory Visit (HOSPITAL_COMMUNITY): Payer: Self-pay | Admitting: Internal Medicine

## 2023-12-22 DIAGNOSIS — Z0189 Encounter for other specified special examinations: Secondary | ICD-10-CM

## 2024-01-15 ENCOUNTER — Ambulatory Visit (HOSPITAL_COMMUNITY): Admission: RE | Admit: 2024-01-15 | Payer: No Typology Code available for payment source | Source: Ambulatory Visit

## 2024-01-15 ENCOUNTER — Encounter (HOSPITAL_COMMUNITY): Payer: Self-pay

## 2024-06-14 NOTE — H&P (Addendum)
 [facility]  [facility] Surgical History and Physical  PATIENT NAME: Steve Bryan PATIENT MRN: [id number] DATE OF SURGERY: 06/19/2024  Identification: Mr. Steve Bryan is a 63 y.o. year old male.  History of Present Illness Update from Previous Visit: Reviewed, no changes from last visit  Overall Pain: Unchanged from previous visit  Past Medical History: Medical History[1]  Past Surgical History: Surgical History[2]  Family History: Family History[3]  Social History: Reviewed in patient chart  Allergies: is allergic to lisinopril and venlafaxine.  Review of Systems: No relevant updates.  Current Medications: See outpatient medication list in patient's chart.  Physical Exam: AFVSS Chest: unlabored breathing, equal chest expansion bilaterally, normal rate Cardiac: regular rate, no jugular venous distention or peripheral edema MSK/Neuro exam unchanged from previous visit  Diagnosis: Chronic knee pain, knee osteoarthritis  Impression Narrative: Appropriate for planned procedure. The patient is appropriate for performance of the procedure in the ambulatory surgery setting.  Patient Education - A description of the planned procedure was provided to the patient.  The risks and benefits of the procedure were discussed with the patient and all questions were addressed to the patients satisfaction. The risks of the procedure include (but are not limited to) increased pain following the procedure, failure of procedure to alleviate pain, and the fading of local anesthetic effect among other risks as specified on the procedure consent formed signed by the patient and myself.  NPO guidelines were discussed with patient, if indicated.  Plan: Continue with planned procedure.  Physician Attestation: Seen by Toribio Badder, MD       [1] Past Medical History: Diagnosis Date  . Cancer of prostate    (CMD)   .  Hypertension   . Memory loss   [2] Past Surgical History: Procedure Laterality Date  . HERNIA REPAIR     Procedure: HERNIA REPAIR; as infant  . KNEE SURGERY Right    Procedure: KNEE SURGERY  [3] Family History Problem Relation Name Age of Onset  . Diabetes Father    . Cancer Father    . Diabetes Sister    . Diabetes Brother    . Alzheimer's disease Maternal Grandmother    . Diabetes Maternal Grandmother    . Alzheimer's disease Paternal Grandfather    . Diabetes Brother    . Dementia Mother    . Stroke Neg Hx

## 2024-08-07 ENCOUNTER — Encounter (INDEPENDENT_AMBULATORY_CARE_PROVIDER_SITE_OTHER): Payer: Self-pay | Admitting: *Deleted

## 2024-08-13 ENCOUNTER — Ambulatory Visit (INDEPENDENT_AMBULATORY_CARE_PROVIDER_SITE_OTHER): Payer: No Typology Code available for payment source | Admitting: Gastroenterology

## 2024-09-06 ENCOUNTER — Encounter (INDEPENDENT_AMBULATORY_CARE_PROVIDER_SITE_OTHER): Payer: Self-pay | Admitting: *Deleted

## 2024-10-09 ENCOUNTER — Encounter (INDEPENDENT_AMBULATORY_CARE_PROVIDER_SITE_OTHER): Payer: Self-pay | Admitting: Gastroenterology

## 2024-10-25 NOTE — Progress Notes (Signed)
 Attempted telephone call to patient for follow up post hospital discharge. Left voicemail requesting call back. Coordinator's name and contact information provided.
